# Patient Record
Sex: Female | Born: 1980 | Race: Black or African American | Hispanic: No | Marital: Single | Smoking: Current some day smoker
Health system: Southern US, Community
[De-identification: ages and names within clinical notes are randomized; demographics above are authoritative.]

## PROBLEM LIST (undated history)

## (undated) DIAGNOSIS — B9689 Other specified bacterial agents as the cause of diseases classified elsewhere: Secondary | ICD-10-CM

## (undated) DIAGNOSIS — N760 Acute vaginitis: Secondary | ICD-10-CM

## (undated) HISTORY — PX: OTHER SURGICAL HISTORY: SHX169

---

## 1998-03-30 HISTORY — PX: DILATION AND CURETTAGE OF UTERUS: SHX78

## 1998-10-08 ENCOUNTER — Emergency Department (HOSPITAL_COMMUNITY): Admission: EM | Admit: 1998-10-08 | Discharge: 1998-10-08 | Payer: Self-pay | Admitting: Emergency Medicine

## 1998-11-30 ENCOUNTER — Emergency Department (HOSPITAL_COMMUNITY): Admission: EM | Admit: 1998-11-30 | Discharge: 1998-11-30 | Payer: Self-pay | Admitting: Emergency Medicine

## 1999-02-08 ENCOUNTER — Emergency Department (HOSPITAL_COMMUNITY): Admission: EM | Admit: 1999-02-08 | Discharge: 1999-02-09 | Payer: Self-pay | Admitting: Emergency Medicine

## 1999-03-09 ENCOUNTER — Emergency Department (HOSPITAL_COMMUNITY): Admission: EM | Admit: 1999-03-09 | Discharge: 1999-03-09 | Payer: Self-pay | Admitting: Emergency Medicine

## 1999-03-18 ENCOUNTER — Emergency Department (HOSPITAL_COMMUNITY): Admission: EM | Admit: 1999-03-18 | Discharge: 1999-03-19 | Payer: Self-pay

## 1999-03-18 ENCOUNTER — Encounter: Payer: Self-pay | Admitting: Emergency Medicine

## 1999-11-04 ENCOUNTER — Emergency Department (HOSPITAL_COMMUNITY): Admission: EM | Admit: 1999-11-04 | Discharge: 1999-11-04 | Payer: Self-pay | Admitting: Emergency Medicine

## 1999-12-04 ENCOUNTER — Inpatient Hospital Stay (HOSPITAL_COMMUNITY): Admission: AD | Admit: 1999-12-04 | Discharge: 1999-12-04 | Payer: Self-pay | Admitting: Obstetrics & Gynecology

## 2000-10-08 ENCOUNTER — Emergency Department (HOSPITAL_COMMUNITY): Admission: EM | Admit: 2000-10-08 | Discharge: 2000-10-08 | Payer: Self-pay

## 2000-11-12 ENCOUNTER — Emergency Department (HOSPITAL_COMMUNITY): Admission: EM | Admit: 2000-11-12 | Discharge: 2000-11-12 | Payer: Self-pay | Admitting: Emergency Medicine

## 2000-11-12 ENCOUNTER — Encounter: Payer: Self-pay | Admitting: Emergency Medicine

## 2000-12-18 ENCOUNTER — Emergency Department (HOSPITAL_COMMUNITY): Admission: EM | Admit: 2000-12-18 | Discharge: 2000-12-18 | Payer: Self-pay | Admitting: Emergency Medicine

## 2001-01-15 ENCOUNTER — Inpatient Hospital Stay (HOSPITAL_COMMUNITY): Admission: AD | Admit: 2001-01-15 | Discharge: 2001-01-15 | Payer: Self-pay | Admitting: *Deleted

## 2001-05-10 ENCOUNTER — Inpatient Hospital Stay (HOSPITAL_COMMUNITY): Admission: AD | Admit: 2001-05-10 | Discharge: 2001-05-10 | Payer: Self-pay | Admitting: *Deleted

## 2001-05-24 ENCOUNTER — Emergency Department (HOSPITAL_COMMUNITY): Admission: EM | Admit: 2001-05-24 | Discharge: 2001-05-24 | Payer: Self-pay | Admitting: Emergency Medicine

## 2001-10-17 ENCOUNTER — Inpatient Hospital Stay (HOSPITAL_COMMUNITY): Admission: AD | Admit: 2001-10-17 | Discharge: 2001-10-17 | Payer: Self-pay | Admitting: *Deleted

## 2002-01-11 ENCOUNTER — Emergency Department (HOSPITAL_COMMUNITY): Admission: EM | Admit: 2002-01-11 | Discharge: 2002-01-11 | Payer: Self-pay | Admitting: *Deleted

## 2002-01-11 ENCOUNTER — Encounter: Payer: Self-pay | Admitting: *Deleted

## 2002-05-29 ENCOUNTER — Emergency Department (HOSPITAL_COMMUNITY): Admission: EM | Admit: 2002-05-29 | Discharge: 2002-05-29 | Payer: Self-pay | Admitting: Emergency Medicine

## 2002-07-20 ENCOUNTER — Emergency Department (HOSPITAL_COMMUNITY): Admission: EM | Admit: 2002-07-20 | Discharge: 2002-07-20 | Payer: Self-pay | Admitting: Emergency Medicine

## 2002-07-20 ENCOUNTER — Encounter: Payer: Self-pay | Admitting: Internal Medicine

## 2002-10-31 ENCOUNTER — Emergency Department (HOSPITAL_COMMUNITY): Admission: EM | Admit: 2002-10-31 | Discharge: 2002-10-31 | Payer: Self-pay | Admitting: Emergency Medicine

## 2003-04-02 ENCOUNTER — Emergency Department (HOSPITAL_COMMUNITY): Admission: EM | Admit: 2003-04-02 | Discharge: 2003-04-02 | Payer: Self-pay | Admitting: Emergency Medicine

## 2003-08-08 ENCOUNTER — Emergency Department (HOSPITAL_COMMUNITY): Admission: EM | Admit: 2003-08-08 | Discharge: 2003-08-08 | Payer: Self-pay | Admitting: Emergency Medicine

## 2003-10-09 ENCOUNTER — Inpatient Hospital Stay (HOSPITAL_COMMUNITY): Admission: AD | Admit: 2003-10-09 | Discharge: 2003-10-09 | Payer: Self-pay | Admitting: Gynecology

## 2004-02-19 ENCOUNTER — Emergency Department (HOSPITAL_COMMUNITY): Admission: EM | Admit: 2004-02-19 | Discharge: 2004-02-19 | Payer: Self-pay | Admitting: Emergency Medicine

## 2004-03-13 ENCOUNTER — Emergency Department (HOSPITAL_COMMUNITY): Admission: EM | Admit: 2004-03-13 | Discharge: 2004-03-13 | Payer: Self-pay | Admitting: Emergency Medicine

## 2004-06-30 ENCOUNTER — Emergency Department (HOSPITAL_COMMUNITY): Admission: EM | Admit: 2004-06-30 | Discharge: 2004-06-30 | Payer: Self-pay | Admitting: Emergency Medicine

## 2004-09-15 ENCOUNTER — Emergency Department (HOSPITAL_COMMUNITY): Admission: EM | Admit: 2004-09-15 | Discharge: 2004-09-15 | Payer: Self-pay | Admitting: Emergency Medicine

## 2004-10-09 ENCOUNTER — Emergency Department (HOSPITAL_COMMUNITY): Admission: EM | Admit: 2004-10-09 | Discharge: 2004-10-10 | Payer: Self-pay | Admitting: Emergency Medicine

## 2005-03-31 ENCOUNTER — Emergency Department (HOSPITAL_COMMUNITY): Admission: EM | Admit: 2005-03-31 | Discharge: 2005-04-01 | Payer: Self-pay | Admitting: Emergency Medicine

## 2005-10-08 ENCOUNTER — Other Ambulatory Visit: Admission: RE | Admit: 2005-10-08 | Discharge: 2005-10-08 | Payer: Self-pay | Admitting: Obstetrics and Gynecology

## 2006-01-08 ENCOUNTER — Emergency Department (HOSPITAL_COMMUNITY): Admission: EM | Admit: 2006-01-08 | Discharge: 2006-01-09 | Payer: Self-pay | Admitting: Emergency Medicine

## 2006-04-22 ENCOUNTER — Emergency Department (HOSPITAL_COMMUNITY): Admission: EM | Admit: 2006-04-22 | Discharge: 2006-04-22 | Payer: Self-pay | Admitting: Emergency Medicine

## 2006-09-14 ENCOUNTER — Emergency Department (HOSPITAL_COMMUNITY): Admission: EM | Admit: 2006-09-14 | Discharge: 2006-09-14 | Payer: Self-pay | Admitting: *Deleted

## 2007-01-20 ENCOUNTER — Emergency Department (HOSPITAL_COMMUNITY): Admission: EM | Admit: 2007-01-20 | Discharge: 2007-01-20 | Payer: Self-pay | Admitting: Family Medicine

## 2007-03-25 ENCOUNTER — Emergency Department (HOSPITAL_COMMUNITY): Admission: EM | Admit: 2007-03-25 | Discharge: 2007-03-25 | Payer: Self-pay | Admitting: Emergency Medicine

## 2007-05-23 ENCOUNTER — Inpatient Hospital Stay (HOSPITAL_COMMUNITY): Admission: AD | Admit: 2007-05-23 | Discharge: 2007-05-23 | Payer: Self-pay | Admitting: Family Medicine

## 2007-10-20 ENCOUNTER — Emergency Department (HOSPITAL_COMMUNITY): Admission: EM | Admit: 2007-10-20 | Discharge: 2007-10-20 | Payer: Self-pay | Admitting: Family Medicine

## 2008-06-10 ENCOUNTER — Emergency Department (HOSPITAL_COMMUNITY): Admission: EM | Admit: 2008-06-10 | Discharge: 2008-06-10 | Payer: Self-pay | Admitting: Family Medicine

## 2008-08-14 ENCOUNTER — Encounter (INDEPENDENT_AMBULATORY_CARE_PROVIDER_SITE_OTHER): Payer: Self-pay | Admitting: Obstetrics and Gynecology

## 2008-08-14 ENCOUNTER — Ambulatory Visit (HOSPITAL_COMMUNITY): Admission: RE | Admit: 2008-08-14 | Discharge: 2008-08-14 | Payer: Self-pay | Admitting: Obstetrics and Gynecology

## 2008-12-01 ENCOUNTER — Emergency Department (HOSPITAL_COMMUNITY): Admission: EM | Admit: 2008-12-01 | Discharge: 2008-12-01 | Payer: Self-pay | Admitting: Emergency Medicine

## 2009-04-07 ENCOUNTER — Emergency Department (HOSPITAL_COMMUNITY): Admission: EM | Admit: 2009-04-07 | Discharge: 2009-04-07 | Payer: Self-pay | Admitting: Family Medicine

## 2009-06-30 ENCOUNTER — Emergency Department (HOSPITAL_COMMUNITY): Admission: EM | Admit: 2009-06-30 | Discharge: 2009-06-30 | Payer: Self-pay | Admitting: Family Medicine

## 2009-09-22 ENCOUNTER — Emergency Department (HOSPITAL_COMMUNITY): Admission: EM | Admit: 2009-09-22 | Discharge: 2009-09-22 | Payer: Self-pay | Admitting: Family Medicine

## 2009-10-15 ENCOUNTER — Emergency Department (HOSPITAL_COMMUNITY): Admission: EM | Admit: 2009-10-15 | Discharge: 2009-10-15 | Payer: Self-pay | Admitting: Family Medicine

## 2010-06-14 LAB — POCT URINALYSIS DIP (DEVICE)
Glucose, UA: NEGATIVE mg/dL
Ketones, ur: NEGATIVE mg/dL
Nitrite: NEGATIVE
Protein, ur: 30 mg/dL — AB
Specific Gravity, Urine: >=1.03 (ref 1.005–1.030)
Urobilinogen, UA: 0.2 mg/dL (ref 0.0–1.0)
pH: 6 (ref 5.0–8.0)

## 2010-06-14 LAB — WET PREP, GENITAL
Trich, Wet Prep: NONE SEEN
WBC, Wet Prep HPF POC: NONE SEEN

## 2010-06-14 LAB — POCT PREGNANCY, URINE: Preg Test, Ur: NEGATIVE

## 2010-06-14 LAB — GC/CHLAMYDIA PROBE AMP, GENITAL: Chlamydia, DNA Probe: NEGATIVE

## 2010-06-18 LAB — POCT URINALYSIS DIP (DEVICE)
Ketones, ur: 15 mg/dL — AB
Nitrite: NEGATIVE
Protein, ur: 30 mg/dL — AB
Urobilinogen, UA: 1 mg/dL (ref 0.0–1.0)

## 2010-06-18 LAB — POCT PREGNANCY, URINE: Preg Test, Ur: NEGATIVE

## 2010-06-18 LAB — WET PREP, GENITAL: Yeast Wet Prep HPF POC: NONE SEEN

## 2010-07-04 LAB — POCT RAPID STREP A (OFFICE): Streptococcus, Group A Screen (Direct): NEGATIVE

## 2010-07-08 LAB — CBC
HCT: 31.5 % — ABNORMAL LOW (ref 36.0–46.0)
Hemoglobin: 10.8 g/dL — ABNORMAL LOW (ref 12.0–15.0)
RBC: 3.32 MIL/uL — ABNORMAL LOW (ref 3.87–5.11)
WBC: 7.7 10*3/uL (ref 4.0–10.5)

## 2010-08-12 NOTE — Op Note (Signed)
Erica Norris, Erica Norris               ACCOUNT NO.:  [id number]   MEDICAL RECORD NO.:  [id number]          PATIENT TYPE:  AMB   LOCATION:  SDC                           FACILITY:  [facility]   PHYSICIAN:  Osborn Coho, M.D.   DATE OF BIRTH:  [birth year]   DATE OF PROCEDURE:  08/14/2008  DATE OF DISCHARGE:  08/14/2008                               OPERATIVE REPORT   PREOPERATIVE DIAGNOSES:  1. Menorrhagia.  2. Hematometra.   POSTOPERATIVE DIAGNOSES:  1. Menorrhagia.  2. Hematometra.   PROCEDURE:  1. Hysteroscopy.  2. Dilation and curettage.   ATTENDING DOCTOR:  Osborn Coho, MD   ANESTHESIA:  General.   FINDINGS:  Uterus sounded to 13 cm and dark blood returned after  dilating the cervix.   FLUIDS:  900 mL.   URINE OUTPUT:  Quantity sufficient via straight cath prior to procedure  and after.   ESTIMATED BLOOD LOSS:  Minimal.   SPECIMENS TO PATHOLOGY:  Endometrial curettings.   COMPLICATIONS:  None.   PROCEDURE:  The patient was taken to the operating room after risks,  benefits, and alternatives discussed with the patient.  The patient  verbalized understanding and consent signed and witnessed.  The patient  was placed under general anesthesia and prepped and draped in the normal  sterile fashion.  A bivalve speculum was placed on the patient's vagina  and a paracervical block administered using a total of 10 mL of 1%  lidocaine.  The anterior lip of the cervix was grasped with single-tooth  tenaculum and the cervix was dilated for passage of the hysteroscope.  The small hysteroscope was initially used and there was absolutely no  visualization upon immediate entry.  The hysteroscope was removed and  curettage was performed and endometrium difficult to feel with the  curette.  The cervix was then dilated further and old blood was noted to  come from the cervix.  The curettage was then performed once again and  minimal tissue returned.  Hysteroscopy was performed once again  after  using a slightly larger hysteroscope and still poor visualization was  noted.  However, by slowly taking hysteroscope and looking closely at  the endometrium, there was no obvious intracavitary lesions noted.  All  instruments were removed.  There was good hemostasis at tenaculum site.  Count was correct.  The patient tolerated the procedure well and is  currently awaiting transfer to the recovery room in good condition.       Osborn Coho, M.D.  Electronically Signed     AR/MEDQ  D:  08/14/2008  T:  08/15/2008  Job:  [id number]

## 2010-12-05 ENCOUNTER — Inpatient Hospital Stay (INDEPENDENT_AMBULATORY_CARE_PROVIDER_SITE_OTHER)
Admission: RE | Admit: 2010-12-05 | Discharge: 2010-12-05 | Disposition: A | Discharge status: Home / Self Care | Payer: BC Managed Care – PPO | Source: Ambulatory Visit | Attending: Family Medicine | Admitting: Family Medicine

## 2010-12-05 DIAGNOSIS — N760 Acute vaginitis: Secondary | ICD-10-CM

## 2010-12-05 DIAGNOSIS — M545 Low back pain: Secondary | ICD-10-CM

## 2010-12-05 DIAGNOSIS — A499 Bacterial infection, unspecified: Secondary | ICD-10-CM

## 2010-12-05 LAB — WET PREP, GENITAL: Trich, Wet Prep: NONE SEEN

## 2010-12-05 LAB — POCT URINALYSIS DIP (DEVICE)
Glucose, UA: NEGATIVE mg/dL
Hgb urine dipstick: NEGATIVE
Ketones, ur: NEGATIVE mg/dL
pH: 7.5 (ref 5.0–8.0)

## 2010-12-06 LAB — GC/CHLAMYDIA PROBE AMP, GENITAL
Chlamydia, DNA Probe: NEGATIVE
GC Probe Amp, Genital: NEGATIVE

## 2010-12-19 LAB — URINALYSIS, ROUTINE W REFLEX MICROSCOPIC
Glucose, UA: NEGATIVE
Ketones, ur: NEGATIVE
Nitrite: NEGATIVE
Protein, ur: NEGATIVE
pH: 6.5

## 2010-12-19 LAB — WET PREP, GENITAL
Trich, Wet Prep: NONE SEEN
Yeast Wet Prep HPF POC: NONE SEEN

## 2010-12-19 LAB — CBC
MCHC: 34.3
MCV: 92.7
Platelets: 235
RBC: 3.89

## 2010-12-19 LAB — POCT PREGNANCY, URINE: Preg Test, Ur: NEGATIVE

## 2010-12-26 LAB — POCT PREGNANCY, URINE
Operator id: 247071
Preg Test, Ur: NEGATIVE

## 2010-12-26 LAB — POCT URINALYSIS DIP (DEVICE)
Nitrite: NEGATIVE
Protein, ur: NEGATIVE
Urobilinogen, UA: 1
pH: 6

## 2010-12-26 LAB — WET PREP, GENITAL

## 2011-01-02 LAB — CBC
HCT: 41.5
Hemoglobin: 13.9
MCV: 93.2
Platelets: 216
RBC: 4.45
WBC: 7.2

## 2011-01-02 LAB — I-STAT 8, (EC8 V) (CONVERTED LAB)
Bicarbonate: 21.9
Glucose, Bld: 93
Hemoglobin: 15.3 — ABNORMAL HIGH
Sodium: 137
TCO2: 23
pH, Ven: 7.419 — ABNORMAL HIGH

## 2011-01-02 LAB — URINALYSIS, ROUTINE W REFLEX MICROSCOPIC
Glucose, UA: NEGATIVE
pH: 8

## 2011-01-02 LAB — DIFFERENTIAL
Basophils Absolute: 0.1
Eosinophils Relative: 0
Lymphocytes Relative: 24
Neutrophils Relative %: 65

## 2011-01-02 LAB — POCT I-STAT CREATININE
Creatinine, Ser: 0.9
Operator id: 284141

## 2011-01-07 LAB — GC/CHLAMYDIA PROBE AMP, GENITAL
Chlamydia, DNA Probe: NEGATIVE
GC Probe Amp, Genital: NEGATIVE

## 2011-01-07 LAB — POCT URINALYSIS DIP (DEVICE)
Glucose, UA: NEGATIVE
Nitrite: NEGATIVE
Operator id: 239701
Urobilinogen, UA: 1

## 2011-01-07 LAB — POCT PREGNANCY, URINE: Preg Test, Ur: NEGATIVE

## 2011-01-07 LAB — WET PREP, GENITAL: WBC, Wet Prep HPF POC: NONE SEEN

## 2011-01-14 LAB — URINALYSIS, ROUTINE W REFLEX MICROSCOPIC
Glucose, UA: NEGATIVE
Hgb urine dipstick: NEGATIVE
Specific Gravity, Urine: 1.023
Urobilinogen, UA: 0.2

## 2011-01-14 LAB — GC/CHLAMYDIA PROBE AMP, GENITAL: GC Probe Amp, Genital: NEGATIVE

## 2011-01-14 LAB — WET PREP, GENITAL
Trich, Wet Prep: NONE SEEN
Yeast Wet Prep HPF POC: NONE SEEN

## 2011-02-04 ENCOUNTER — Other Ambulatory Visit: Payer: Self-pay | Admitting: Obstetrics and Gynecology

## 2012-02-15 ENCOUNTER — Encounter (HOSPITAL_COMMUNITY): Payer: Self-pay | Admitting: *Deleted

## 2012-02-15 ENCOUNTER — Emergency Department (INDEPENDENT_AMBULATORY_CARE_PROVIDER_SITE_OTHER)
Admission: EM | Admit: 2012-02-15 | Discharge: 2012-02-15 | Disposition: A | Discharge status: Home / Self Care | Payer: Self-pay | Source: Home / Self Care | Attending: Emergency Medicine | Admitting: Emergency Medicine

## 2012-02-15 DIAGNOSIS — L0292 Furuncle, unspecified: Secondary | ICD-10-CM

## 2012-02-15 DIAGNOSIS — N898 Other specified noninflammatory disorders of vagina: Secondary | ICD-10-CM

## 2012-02-15 DIAGNOSIS — L0293 Carbuncle, unspecified: Secondary | ICD-10-CM

## 2012-02-15 LAB — POCT URINALYSIS DIP (DEVICE)
Bilirubin Urine: NEGATIVE
Hgb urine dipstick: NEGATIVE
Nitrite: NEGATIVE
Specific Gravity, Urine: 1.02 (ref 1.005–1.030)
pH: 7.5 (ref 5.0–8.0)

## 2012-02-15 LAB — WET PREP, GENITAL: Trich, Wet Prep: NONE SEEN

## 2012-02-15 MED ORDER — METRONIDAZOLE 500 MG PO TABS: 500.0000 mg | ORAL_TABLET | Freq: Two times a day (BID) | ORAL | Status: DC

## 2012-02-15 NOTE — ED Provider Notes (Signed)
History     CSN: [id number]  Arrival date & time 02/15/12  4098   First MD Initiated Contact with Patient 02/15/12 0845      Chief Complaint  Patient presents with  . Vaginal Discharge    (Consider location/radiation/quality/duration/timing/severity/associated sxs/prior treatment) Patient is a 31 y.o. female presenting with vaginal discharge.  Vaginal Discharge   31 y.o. female complains of malodorous and thick vaginal discharge for 1 week. Has tried OTC product and states that the vaginal discharge has increase ever since.  Denies abnormal vaginal bleeding, no pruritus, significant pelvic pain or fever. No UTI symptoms. Sexually active, does use condoms, no change in partner. Has a Nuvaring in place.  Last unprotected intercourse 8 days ago.  Denies history of known exposure to STD or symptoms in partner.  No history of STD's. Last Pap Smear was performed in Dec 2012-NIL.  Past history of BV: was treated 6 months ago with metronidazole for BV. Didn't take the last 2 doses; explained that it made her very nauseated.   Reports presence of a small boil in  R inguinal area. Reports to shave and lately changed to [facility] hair removal. Applied heat pad to relieve pain. Noted improvement in size and symptoms.  Pain is 8/10 with exacerbation at ambulation and local palpation. No OTC taken at this present moment.    History reviewed. No pertinent past medical history.  History reviewed. No pertinent past surgical history.  No family history on file.  History  Substance Use Topics  . Smoking status: Never Smoker   . Smokeless tobacco: Not on file  . Alcohol Use: Yes    OB History    Grav Para Term Preterm Abortions TAB SAB Ect Mult Living                  Review of Systems  Constitutional: Negative.   HENT: Negative.   Respiratory: Negative.   Cardiovascular: Negative.   Gastrointestinal: Negative.   Genitourinary: Positive for vaginal discharge. Negative for flank pain,  decreased urine volume, vaginal bleeding, difficulty urinating, vaginal pain, pelvic pain and dyspareunia.    Allergies  Flagyl and Penicillins  Home Medications   Current Outpatient Rx  Name  Route  Sig  Dispense  Refill  . METRONIDAZOLE 500 MG PO TABS   Oral   Take 1 tablet (500 mg total) by mouth 2 (two) times daily.   14 tablet   0     BP 135/72  Pulse 68  Temp 98.3 F (36.8 C) (Oral)  Resp 18  SpO2 100%  Physical Exam  Nursing note and vitals reviewed. Constitutional: She is oriented to person, place, and time. Vital signs are normal. She appears well-developed and well-nourished. She is active and cooperative.  HENT:  Head: Normocephalic and atraumatic.  Eyes: Conjunctivae normal are normal. Pupils are equal, round, and reactive to light. No scleral icterus.  Neck: Trachea normal and normal range of motion. Neck supple.  Cardiovascular: Normal rate, regular rhythm, normal heart sounds and normal pulses.   Pulmonary/Chest: Effort normal and breath sounds normal.  Abdominal: Soft. Normal appearance and bowel sounds are normal. There is no tenderness. Hernia confirmed negative in the right inguinal area and confirmed negative in the left inguinal area.  Genitourinary: Uterus normal. There is no rash, tenderness, lesion or injury on the right labia. There is no rash, tenderness, lesion or injury on the left labia. Uterus is not deviated, not enlarged, not fixed and not tender. Cervix exhibits discharge.  Cervix exhibits no motion tenderness and no friability. Right adnexum displays no mass, no tenderness and no fullness. Left adnexum displays no mass, no tenderness and no fullness. No erythema or bleeding around the vagina. No foreign body around the vagina. No signs of injury around the vagina. Vaginal discharge found.       Tenderness, furuncle on right mons  Lymphadenopathy:       Right: No inguinal adenopathy present.       Left: No inguinal adenopathy present.    Neurological: She is alert and oriented to person, place, and time. She has normal reflexes. No cranial nerve deficit or sensory deficit.  Skin: Skin is warm and dry.  Psychiatric: She has a normal mood and affect. Her speech is normal and behavior is normal. Judgment and thought content normal. Cognition and memory are normal.    ED Course  Procedures (including critical care time)  Labs Reviewed  WET PREP, GENITAL - Abnormal; Notable for the following:    Clue Cells Wet Prep HPF POC FEW (*)     WBC, Wet Prep HPF POC FEW (*)     All other components within normal limits  POCT URINALYSIS DIP (DEVICE)  POCT PREGNANCY, URINE  GC/CHLAMYDIA PROBE AMP   No results found.   1. Vaginal discharge   2. Furuncle       MDM  Avoid drinking alcohol with Flagyl. Take the entire course of antibiotic. Return to the clinic if worsening of any symptoms. It is recommended to not have sexual intercourse for the entire period of treatment.         Johnsie Kindred, NP 02/15/12 1210

## 2012-02-15 NOTE — ED Notes (Signed)
Pt  Reports  Symptoms  Of  Vaginal      Discharge           X   1    Week           As  Well  As   A  Boil  In  Genital  Area     X  3  Weeks            Pt  Also  Reports  Some  Low  abd  Cramping as  Well

## 2012-02-15 NOTE — ED Provider Notes (Signed)
Medical screening examination/treatment/procedure(s) were performed by non-physician practitioner and as supervising physician I was immediately available for consultation/collaboration.  Leslee Home, M.D.   Reuben Likes, MD 02/15/12 [phone]

## 2012-02-16 LAB — GC/CHLAMYDIA PROBE AMP
CT Probe RNA: NEGATIVE
GC Probe RNA: NEGATIVE

## 2013-04-02 ENCOUNTER — Emergency Department (INDEPENDENT_AMBULATORY_CARE_PROVIDER_SITE_OTHER)
Admission: EM | Admit: 2013-04-02 | Discharge: 2013-04-02 | Disposition: A | Discharge status: Home / Self Care | Payer: BC Managed Care – PPO | Source: Home / Self Care | Attending: Family Medicine | Admitting: Family Medicine

## 2013-04-02 ENCOUNTER — Encounter (HOSPITAL_COMMUNITY): Payer: Self-pay | Admitting: Emergency Medicine

## 2013-04-02 ENCOUNTER — Other Ambulatory Visit (HOSPITAL_COMMUNITY)
Admission: RE | Admit: 2013-04-02 | Discharge: 2013-04-02 | Disposition: A | Discharge status: Home / Self Care | Payer: BC Managed Care – PPO | Source: Ambulatory Visit | Attending: Family Medicine | Admitting: Family Medicine

## 2013-04-02 DIAGNOSIS — A084 Viral intestinal infection, unspecified: Secondary | ICD-10-CM

## 2013-04-02 DIAGNOSIS — Z113 Encounter for screening for infections with a predominantly sexual mode of transmission: Secondary | ICD-10-CM | POA: Insufficient documentation

## 2013-04-02 DIAGNOSIS — N760 Acute vaginitis: Secondary | ICD-10-CM

## 2013-04-02 DIAGNOSIS — A088 Other specified intestinal infections: Secondary | ICD-10-CM

## 2013-04-02 HISTORY — DX: Acute vaginitis: N76.0

## 2013-04-02 HISTORY — DX: Other specified bacterial agents as the cause of diseases classified elsewhere: B96.89

## 2013-04-02 LAB — CBC
HEMATOCRIT: 41.1 % (ref 36.0–46.0)
Hemoglobin: 14.1 g/dL (ref 12.0–15.0)
MCH: 31.4 pg (ref 26.0–34.0)
MCHC: 34.3 g/dL (ref 30.0–36.0)
MCV: 91.5 fL (ref 78.0–100.0)
Platelets: 267 10*3/uL (ref 150–400)
RBC: 4.49 MIL/uL (ref 3.87–5.11)
RDW: 12.6 % (ref 11.5–15.5)
WBC: 7.1 10*3/uL (ref 4.0–10.5)

## 2013-04-02 LAB — POCT URINALYSIS DIP (DEVICE)
Bilirubin Urine: NEGATIVE
Glucose, UA: NEGATIVE mg/dL
Hgb urine dipstick: NEGATIVE
Ketones, ur: NEGATIVE mg/dL
LEUKOCYTES UA: NEGATIVE
Nitrite: NEGATIVE
PH: 5.5 (ref 5.0–8.0)
PROTEIN: NEGATIVE mg/dL
Specific Gravity, Urine: >=1.03 (ref 1.005–1.030)
Urobilinogen, UA: 0.2 mg/dL (ref 0.0–1.0)

## 2013-04-02 LAB — COMPREHENSIVE METABOLIC PANEL WITH GFR
ALT: 14 U/L (ref 0–35)
AST: 14 U/L (ref 0–37)
Albumin: 3.5 g/dL (ref 3.5–5.2)
Alkaline Phosphatase: 57 U/L (ref 39–117)
BUN: 10 mg/dL (ref 6–23)
CO2: 25 meq/L (ref 19–32)
Calcium: 9.3 mg/dL (ref 8.4–10.5)
Chloride: 101 meq/L (ref 96–112)
Creatinine, Ser: 0.71 mg/dL (ref 0.50–1.10)
GFR calc Af Amer: >90 mL/min
GFR calc non Af Amer: >90 mL/min
Glucose, Bld: 83 mg/dL (ref 70–99)
Potassium: 3.6 meq/L — ABNORMAL LOW (ref 3.7–5.3)
Sodium: 139 meq/L (ref 137–147)
Total Bilirubin: 0.5 mg/dL (ref 0.3–1.2)
Total Protein: 7.6 g/dL (ref 6.0–8.3)

## 2013-04-02 LAB — POCT I-STAT, CHEM 8
BUN: 9 mg/dL (ref 6–23)
Calcium, Ion: 1.25 mmol/L — ABNORMAL HIGH (ref 1.12–1.23)
Chloride: 103 meq/L (ref 96–112)
Creatinine, Ser: 0.8 mg/dL (ref 0.50–1.10)
Glucose, Bld: 88 mg/dL (ref 70–99)
HCT: 45 % (ref 36.0–46.0)
Hemoglobin: 15.3 g/dL — ABNORMAL HIGH (ref 12.0–15.0)
Potassium: 3.6 meq/L — ABNORMAL LOW (ref 3.7–5.3)
Sodium: 140 meq/L (ref 137–147)
TCO2: 24 mmol/L (ref 0–100)

## 2013-04-02 LAB — POCT PREGNANCY, URINE: Preg Test, Ur: NEGATIVE

## 2013-04-02 MED ORDER — METRONIDAZOLE 0.75 % VA GEL: 1.0000 | Freq: Two times a day (BID) | VAGINAL | Status: DC

## 2013-04-02 NOTE — ED Notes (Signed)
Started with nausea on 12/31; following day started with diarrhea.  Had taken a phenergan at work on 12/31, but relief was short-lived.  Continues with vomiting - able to keep down some fluids, but has intermittent vomiting.  Has had diarrhea x 4-5 each day; took Imodium and has had 3x today.  Denies fevers.  C/O "mild" generalized abd pain and body aches.  Also started with vaginal discharge 12/31 - seems like BV per pt.  Removed Nuva-Ring on Fri.  Has been drinking Gatorade.

## 2013-04-02 NOTE — ED Provider Notes (Signed)
Erica Norris is a 33 y.o. female who presents to Urgent Care today for nausea vomiting and diarrhea present for the last for 5 days. 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 The vomiting has resolved. No bloody diarrhea. She's tried some Imodium and Pepto-Bismol which has helped. She notes that she also has vaginal discharge consistent with prior episodes of bacterial vaginosis. She typically gets BV when she gets sick. She also notes mild abdominal pain. No fevers or chills.   Past Medical History  Diagnosis Date  . BV (bacterial vaginosis)    History  Substance Use Topics  . Smoking status: Never Smoker   . Smokeless tobacco: Not on file  . Alcohol Use: Yes     Comment: occasional   ROS as above Medications reviewed. No current facility-administered medications for this encounter.   Current Outpatient Prescriptions  Medication Sig Dispense Refill  . etonogestrel-ethinyl estradiol (NUVARING) 0.12-0.015 MG/24HR vaginal ring Place 1 each vaginally every 28 (twenty-eight) days. Insert vaginally and leave in place for 3 consecutive weeks, then remove for 1 week.      . metroNIDAZOLE (METROGEL) 0.75 % vaginal gel Place 1 Applicatorful vaginally 2 (two) times daily. 5 days disp qs  70 g  1    Exam:  BP 148/85  Pulse 68  Temp(Src) 98.6 F (37 C) (Oral)  Resp 16  SpO2 100%  LMP 03/02/2013 Gen: Well NAD HEENT: EOMI,  MMM Lungs: Normal work of breathing. CTABL Heart: RRR no MRG Abd: NABS, Soft. Mildly tender palpation right lower quadrant without any rebound. Mild guarding present. Negative psoas sign. Exts: Non edematous BL  LE, warm and well perfused.  GYN: Normal appearing external genitalia. Vaginal canal with white discharge. Normal-appearing cervix. No cervical motion tenderness or adnexal mass or tenderness.  Results for orders placed during the hospital encounter of 04/02/13 (from the past 24 hour(s))  CBC     Status:  None   Collection Time    04/02/13  3:25 PM      Result Value Range   WBC 7.1  4.0 - 10.5 K/uL   RBC 4.49  3.87 - 5.11 MIL/uL   Hemoglobin 14.1  12.0 - 15.0 g/dL   HCT 16.141.1  09.636.0 - 04.546.0 %   MCV 91.5  78.0 - 100.0 fL   MCH 31.4  26.0 - 34.0 pg   MCHC 34.3  30.0 - 36.0 g/dL   RDW 40.912.6  81.111.5 - 91.415.5 %   Platelets 267  150 - 400 K/uL  COMPREHENSIVE METABOLIC PANEL     Status: Abnormal   Collection Time    04/02/13  3:25 PM      Result Value Range   Sodium 139  137 - 147 mEq/L   Potassium 3.6 (*) 3.7 - 5.3 mEq/L   Chloride 101  96 - 112 mEq/L   CO2 25  19 - 32 mEq/L   Glucose, Bld 83  70 - 99 mg/dL   BUN 10  6 - 23 mg/dL   Creatinine, Ser 7.820.71  0.50 - 1.10 mg/dL   Calcium 9.3  8.4 - 95.610.5 mg/dL   Total Protein 7.6  6.0 - 8.3 g/dL   Albumin 3.5  3.5 - 5.2 g/dL   AST 14  0 - 37 U/L   ALT 14  0 - 35 U/L   Alkaline Phosphatase 57  39 - 117 U/L   Total Bilirubin 0.5  0.3 - 1.2  mg/dL   GFR calc non Af Amer >90  >90 mL/min   GFR calc Af Amer >90  >90 mL/min  POCT URINALYSIS DIP (DEVICE)     Status: None   Collection Time    04/02/13  3:47 PM      Result Value Range   Glucose, UA NEGATIVE  NEGATIVE mg/dL   Bilirubin Urine NEGATIVE  NEGATIVE   Ketones, ur NEGATIVE  NEGATIVE mg/dL   Specific Gravity, Urine >=1.030  1.005 - 1.030   Hgb urine dipstick NEGATIVE  NEGATIVE   pH 5.5  5.0 - 8.0   Protein, ur NEGATIVE  NEGATIVE mg/dL   Urobilinogen, UA 0.2  0.0 - 1.0 mg/dL   Nitrite NEGATIVE  NEGATIVE   Leukocytes, UA NEGATIVE  NEGATIVE  POCT PREGNANCY, URINE     Status: None   Collection Time    04/02/13  3:53 PM      Result Value Range   Preg Test, Ur NEGATIVE  NEGATIVE  POCT I-STAT, CHEM 8     Status: Abnormal   Collection Time    04/02/13  3:58 PM      Result Value Range   Sodium 140  137 - 147 mEq/L   Potassium 3.6 (*) 3.7 - 5.3 mEq/L   Chloride 103  96 - 112 mEq/L   BUN 9  6 - 23 mg/dL   Creatinine, Ser 1.32  0.50 - 1.10 mg/dL   Glucose, Bld 88  70 - 99 mg/dL   Calcium, Ion  4.40 (*) 1.12 - 1.23 mmol/L   TCO2 24  0 - 100 mmol/L   Hemoglobin 15.3 (*) 12.0 - 15.0 g/dL   HCT 10.2  72.5 - 36.6 %   No results found.  Assessment and Plan: 33 y.o. female with  1) viral gastroenteritis. Plan for treatment with hydration time and over-the-counter Imodium as needed. Followup with primary care provider. Serious abdominal etiology unlikely given normal labs above. 2) vaginal discharge. Likely BV. Cytology pending. Treat empirically with metronidazole gel vaginal treatment to avoid GI upset with oral metronidazole. Discussed warning signs or symptoms. Please see discharge instructions. Patient expresses understanding.      Rodolph Bong, MD 04/02/13 [phone]

## 2013-04-02 NOTE — Discharge Instructions (Signed)
Thank you for coming in today. Use vaginal MetroGel for 5 days.  Use Imodium as needed Skin hydrated If your belly pain worsens, or you have high fever, bad vomiting, blood in your stool or black tarry stool go to the Emergency Room.

## 2013-05-26 ENCOUNTER — Other Ambulatory Visit: Payer: Self-pay | Admitting: Obstetrics and Gynecology

## 2013-08-06 ENCOUNTER — Emergency Department (INDEPENDENT_AMBULATORY_CARE_PROVIDER_SITE_OTHER): Payer: BC Managed Care – PPO

## 2013-08-06 ENCOUNTER — Encounter (HOSPITAL_COMMUNITY): Payer: Self-pay | Admitting: Emergency Medicine

## 2013-08-06 ENCOUNTER — Emergency Department (INDEPENDENT_AMBULATORY_CARE_PROVIDER_SITE_OTHER)
Admission: EM | Admit: 2013-08-06 | Discharge: 2013-08-06 | Disposition: A | Discharge status: Home / Self Care | Payer: BC Managed Care – PPO | Source: Home / Self Care

## 2013-08-06 DIAGNOSIS — X58XXXA Exposure to other specified factors, initial encounter: Secondary | ICD-10-CM

## 2013-08-06 DIAGNOSIS — S93609A Unspecified sprain of unspecified foot, initial encounter: Secondary | ICD-10-CM

## 2013-08-06 NOTE — ED Provider Notes (Signed)
CSN: [id number]     Arrival date & time 08/06/13  1532 History   None    Chief Complaint  Patient presents with  . Ankle Pain   (Consider location/radiation/quality/duration/timing/severity/associated sxs/prior Treatment) HPI She is here today for left foot and ankle pain. She states this started approximately 1-1/2 weeks ago. She denies any trauma or injury to the area. She denies recent increase in activity. The pain is located on the dorsal aspect of her foot from the second toe extending up to the lateral ankle.  Described as throbbing.  She does report some mild swelling. It is worse with walking. Improve some with rest. She has tried wrapping it and ibuprofen with no improvement. She denies any history of trauma to the ankle or foot.  Past Medical History  Diagnosis Date  . BV (bacterial vaginosis)    Past Surgical History  Procedure Laterality Date  . Dwc    . Dilation and curettage of uterus  2000   Family History  Problem Relation Age of Onset  . Heart attack Father   . Hypertension Sister   . Hypertension Other    History  Substance Use Topics  . Smoking status: Never Smoker   . Smokeless tobacco: Not on file  . Alcohol Use: Yes     Comment: occasional   OB History   Grav Para Term Preterm Abortions TAB SAB Ect Mult Living                 Review of Systems  Constitutional: Negative.   HENT: Negative.   Respiratory: Negative.   Cardiovascular: Negative.   Musculoskeletal: Positive for arthralgias (left foot/ankle pain).    Allergies  Flagyl and Penicillins  Home Medications   Prior to Admission medications   Medication Sig Start Date End Date Taking? Authorizing Provider  etonogestrel-ethinyl estradiol (NUVARING) 0.12-0.015 MG/24HR vaginal ring Place 1 each vaginally every 28 (twenty-eight) days. Insert vaginally and leave in place for 3 consecutive weeks, then remove for 1 week.   Yes Historical Provider, MD  ibuprofen (ADVIL,MOTRIN) 200 MG tablet Take  600 mg by mouth every 6 (six) hours as needed.   Yes Historical Provider, MD  metroNIDAZOLE (METROGEL) 0.75 % vaginal gel Place 1 Applicatorful vaginally 2 (two) times daily. 5 days disp qs 04/02/13   Rodolph BongEvan S Corey, MD   BP 124/94  Pulse 92  Temp(Src) 97.9 F (36.6 C) (Oral)  Resp 16  SpO2 98%  LMP 07/26/2013 Physical Exam  Constitutional: She appears well-developed and well-nourished. No distress.  HENT:  Head: Normocephalic and atraumatic.  Cardiovascular: Normal rate.   Pulmonary/Chest: Effort normal.  Musculoskeletal:       Feet:  Left foot/ankle: no laxity of ankle joint; good strength with some pain on resisted eversion and flexion/extension of 2nd toe; 2+ DP pulse  Skin: She is not diaphoretic.    ED Course  Procedures (including critical care time) Labs Review Labs Reviewed - No data to display  Imaging Review Dg Foot Complete Left  08/06/2013   CLINICAL DATA:  Pain across the top of the left foot. No known injury.  EXAM: LEFT FOOT - COMPLETE 3+ VIEW  COMPARISON:  None.  FINDINGS: Imaged bones, joints and soft tissues appear normal.  IMPRESSION: Negative exam.   Electronically Signed   By: Drusilla Kannerhomas  Dalessio M.D.   On: 08/06/2013 16:46     MDM   1. Sprain of foot    X-ray reviewed and negative for fracture. Suspect strain of her  foot. No evidence for ankle instability. Abdomen conservative management with rest, ice, elevation, NSAIDs. Provided postop shoe to limit mobility of the foot. Follow up with PCP if not improved in 2 weeks    Charm RingsErin J Kajal Scalici, MD 08/06/13 1700

## 2013-08-06 NOTE — Discharge Instructions (Signed)
You sprained something in your foot. Stay off of the foot as much as you can. Continue to ice it at least once a day. Continue with the ibuprofen 3 times a day.  You do not need an ankle brace, but we give you a postop shoe to wear to limit the motion in your foot. This should start to heal in the next one to 2 weeks. It will heal faster the more you can stay off of it.  If it is not improved after 2 weeks please followup with your regular doctor.

## 2013-08-06 NOTE — ED Notes (Signed)
C/o L ankle pain x 1 1/2 weeks.  No known injury.  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  States it is swollen.  Pain is getting worse since Friday. Taking Motrin 600 mg. without relief.

## 2013-08-06 NOTE — ED Provider Notes (Signed)
Medical screening examination/treatment/procedure(s) were performed by a resident physician and as supervising physician I was immediately available for consultation/collaboration.  Leslee Homeavid Shunta Mclaurin, M.D.  Reuben Likesavid C Phila Shoaf, MD 08/06/13 [phone]

## 2013-08-10 ENCOUNTER — Emergency Department (INDEPENDENT_AMBULATORY_CARE_PROVIDER_SITE_OTHER)
Admission: EM | Admit: 2013-08-10 | Discharge: 2013-08-10 | Disposition: A | Discharge status: Home / Self Care | Payer: BC Managed Care – PPO | Source: Home / Self Care | Attending: Family Medicine | Admitting: Family Medicine

## 2013-08-10 ENCOUNTER — Encounter (HOSPITAL_COMMUNITY): Payer: Self-pay | Admitting: Emergency Medicine

## 2013-08-10 DIAGNOSIS — M84376A Stress fracture, unspecified foot, initial encounter for fracture: Secondary | ICD-10-CM

## 2013-08-10 NOTE — ED Provider Notes (Signed)
Erica Norris is a 33 y.o. female who presents to Urgent Care today for left foot pain. Patient was seen 4 days ago for left foot pain. She had a total of about 2 or 3 weeks of pain without injury. She was treated with a postoperative to 4 days ago. This was helping a bit however she were to 16 hour day and has slight slip in the shower the day and her pain is worsened. She is only able to wear the open toe postoperative shoe and one of her 2 jobs. Sure jobs involve prolonged standing. She denies any fevers or chills nausea vomiting or diarrhea. She denies any injury. She notes ibuprofen as well as somewhat helpful for her pain.   Past Medical History  Diagnosis Date  . BV (bacterial vaginosis)    History  Substance Use Topics  . Smoking status: Never Smoker   . Smokeless tobacco: Not on file  . Alcohol Use: Yes     Comment: occasional   ROS as above Medications: No current facility-administered medications for this encounter.   Current Outpatient Prescriptions  Medication Sig Dispense Refill  . etonogestrel-ethinyl estradiol (NUVARING) 0.12-0.015 MG/24HR vaginal ring Place 1 each vaginally every 28 (twenty-eight) days. Insert vaginally and leave in place for 3 consecutive weeks, then remove for 1 week.      Marland Kitchen. ibuprofen (ADVIL,MOTRIN) 200 MG tablet Take 600 mg by mouth every 6 (six) hours as needed.      . metroNIDAZOLE (METROGEL) 0.75 % vaginal gel Place 1 Applicatorful vaginally 2 (two) times daily. 5 days disp qs  70 g  1    Exam:  BP 119/78  Pulse 78  Temp(Src) 98.1 F (36.7 C) (Oral)  Resp 16  SpO2 100%  LMP 07/26/2013 Gen: Well NAD Left foot: Swollen and tender over the mid second metatarsal.  Foot is otherwise normal appearing normal capillary refill pulses and sensation and motion.  Limited musculoskeletal ultrasound left foot:  Significant swelling cortical defect seen at the mid left second metatarsal consistent with stress fracture.  No results found for this or any  previous visit (from the past 24 hour(s)). No results found.  Assessment and Plan: 33 y.o. female with stress fracture of the left second metatarsal. Plan to treat with rigid insoles and vitamin D and calcium. Followup with sports medicine in about 3 weeks for recheck and reevaluation.  Discussed warning signs or symptoms. Please see discharge instructions. Patient expresses understanding.    Rodolph BongEvan S Clearence Vitug, MD 08/10/13 1540

## 2013-08-10 NOTE — ED Notes (Signed)
Reports being seen on Sunday for left foot/ankle injury.  Pt states that she re injured left foot when getting out of the shower she slid causing pain which has been a constant throbbing sensation.  Taking ibuprofen with no relief.

## 2013-08-10 NOTE — Discharge Instructions (Signed)
Thank you for coming in today. Follow up with Dr. Katrinka BlazingSmith. Call now for an appointment in 3 weeks.  Use a rigid insert.  Go to [facility]otech and they will order you one.  You can also order something off the Internet see attached sheet.  Take 2000 mg of calcium and 1000 units of vitamin D daily.  Metatarsal Stress Fracture A stress fracture is a break in a bone of the body that is caused by repeated stress (trauma) that slowly weakens the bone until it eventually breaks. The metatarsal bones are in the middle of the feet, connecting the toes to the ankle. They are vulnerable to stress fractures. Metatarsal stress fractures are the second most common type of stress fracture in athletes. The metatarsal of the pointer toe (second metatarsal) is the most common metatarsal to suffer a stress fracture. SYMPTOMS   Vague, spread out pain or ache. Sometimes, tenderness and swelling in the foot.  Uncommonly, bleeding and bruising in the foot.  Weakness and inability to bear weight on the injured foot.  Paleness and deformity (sometimes). CAUSES  A stress fracture is caused by repeated trauma. This slowly weakens the bone, faster than it can heal itself, until the bone breaks. Stress fractures often follow a sudden change in training schedule. Stress fractures may be related to the loss of menstrual period in women.  RISK INCREASES WITH:   Previous stress fracture.  Sudden changes in training intensity, frequency, or duration Nature conservation officer(military recruits, distance runners).  Bony abnormalities (osteoporosis, tumors).  Metabolism disorders or hormone problems.  Nutrition deficiencies or eating disorders (anorexia or bulimia).  The loss of or irregular menstrual periods in women.  Poor strength and flexibility.  Running on hard surfaces. Poor leg and foot alignment. This includes flat feet.  Poor footwear with poor shock absorbers.  Poor running technique. PREVENTION   Warm up and stretch properly before  activity.  Maintain physical fitness:  Muscle strength.  Endurance and flexibility.  Wear proper and correctly fitted footwear. Replace shoes after 300 to 500 miles of running.  Learn and use proper technique with training and activity.  Increase activity and training gradually.  Treat hormonal disorders. Birth control pills can be helpful for women with menstrual period irregularity.  Correct metabolism and nutrition disorders.  Wear cushioned arch supports for runners with flat feet. PROGNOSIS  With proper treatment, stress fractures usually heal within 6 to 12 weeks. RELATED COMPLICATIONS   Failure to heal (nonunion), especially with stress fractures of the outer foot (upper part of the fifth metatarsal).  Healing in a poor position (malunion).  Recurring stress fracture.  Progression to a complete or displaced fracture.  Risks of surgery: infection, bleeding, injury to nerves (numbness, weakness, paralysis), and need for further surgery.  Repeated stress fracture, not necessarily at the same site. (Occurs in 1 of every 10 patients). TREATMENT Treatment first involves ice and medicine to reduce pain and inflammation. You must rest from any aggravating activity, to avoid making the fracture worse. For severe stress fractures, crutches may be advised, to take weight off the injured foot. Depending on your caregiver's instructions, you may be permitted to perform activities that do not cause pain. Any menstrual, hormonal, or nutritional problems must be addressed and treated. Return to activity must be performed gradually, to avoid reinjuring the foot. Physical therapy may be advised, to help strengthen the foot and regain full function. On rare occasions, surgery is needed. This may be offered if non-surgical treatment is ineffective after  3 to 6 months. MEDICATION   If pain medicine is needed, nonsteroidal anti-inflammatory medicines (NSAIDS) or other minor pain relievers are  often advised.  Do not take pain medicine for 7 days before surgery.  Only take over-the-counter or prescription medicines for pain, discomfort, or fever as directed by your caregiver. SEEK IMMEDIATE MEDICAL CARE IF:  Symptoms get worse or do not improve in 2 weeks, despite treatment. The following occur after immobilization or surgery:  Swelling above or below the fracture site.  Severe, persistent pain.  Blue or gray skin below the fracture site, especially under the toenails. Numbness or loss of feeling below the fracture site.  New, unexplained symptoms develop. (Drugs used in treatment may produce side effects.) Document Released: 03/16/2005 Document Revised: 06/08/2011 Document Reviewed: 06/28/2008 [facility] Patient Information 2014 [place]ExitCare, [place].

## 2013-12-18 ENCOUNTER — Other Ambulatory Visit: Payer: Self-pay | Admitting: Obstetrics and Gynecology

## 2014-03-27 ENCOUNTER — Encounter (HOSPITAL_COMMUNITY): Payer: Self-pay | Admitting: Emergency Medicine

## 2014-03-27 ENCOUNTER — Emergency Department (INDEPENDENT_AMBULATORY_CARE_PROVIDER_SITE_OTHER)
Admission: EM | Admit: 2014-03-27 | Discharge: 2014-03-27 | Disposition: A | Discharge status: Home / Self Care | Payer: BC Managed Care – PPO | Source: Home / Self Care | Attending: Family Medicine | Admitting: Family Medicine

## 2014-03-27 DIAGNOSIS — K529 Noninfective gastroenteritis and colitis, unspecified: Secondary | ICD-10-CM

## 2014-03-27 LAB — POCT URINALYSIS DIP (DEVICE)
Bilirubin Urine: NEGATIVE
Glucose, UA: NEGATIVE mg/dL
Ketones, ur: NEGATIVE mg/dL
Nitrite: NEGATIVE
PROTEIN: NEGATIVE mg/dL
SPECIFIC GRAVITY, URINE: 1.02 (ref 1.005–1.030)
Urobilinogen, UA: 0.2 mg/dL (ref 0.0–1.0)
pH: 6 (ref 5.0–8.0)

## 2014-03-27 LAB — POCT PREGNANCY, URINE: Preg Test, Ur: NEGATIVE

## 2014-03-27 MED ORDER — ONDANSETRON 4 MG PO TBDP
ORAL_TABLET | ORAL | Status: AC
  Filled 2014-03-27: qty 1

## 2014-03-27 MED ORDER — ONDANSETRON HCL 4 MG PO TABS: 4.0000 mg | ORAL_TABLET | Freq: Three times a day (TID) | ORAL | Status: DC | PRN

## 2014-03-27 MED ORDER — ONDANSETRON 4 MG PO TBDP
4.0000 mg | ORAL_TABLET | Freq: Once | ORAL | Status: AC
  Administered 2014-03-27: 4 mg via ORAL

## 2014-03-27 NOTE — ED Provider Notes (Signed)
CSN: [id number]     Arrival date & time 03/27/14  1155 History   First MD Initiated Contact with Patient 03/27/14 1203     Chief Complaint  Patient presents with  . Abdominal Pain  . 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 States diarrhea has improved, still with nausea and occasional vomiting. Denies fever or abdominal pain. States just the muscles of her abdomen are sore from vomiting. Reports herself to be otherwise healthy LNMP: one week ago PCP: Dr. Dareen PianoAnderson   Patient is a 33 y.o. female presenting with vomiting. The history is provided by the patient.  Emesis Severity:  Mild Duration:  4 days Timing:  Intermittent Quality:  Undigested food Able to tolerate:  Liquids Progression:  Improving Recent urination:  Normal Associated symptoms: diarrhea   Associated symptoms: no abdominal pain, no arthralgias, no chills, no cough, no fever, no headaches, no myalgias, no sore throat and no URI   Risk factors: sick contacts   Risk factors: no suspect food intake and no travel to endemic areas     Past Medical History  Diagnosis Date  . BV (bacterial vaginosis)    Past Surgical History  Procedure Laterality Date  . Dwc    . Dilation and curettage of uterus  2000   Family History  Problem Relation Age of Onset  . Heart attack Father   . Hypertension Sister   . Hypertension Other    History  Substance Use Topics  . Smoking status: Never Smoker   . Smokeless tobacco: Not on file  . Alcohol Use: Yes     Comment: occasional   OB History    No data available     Review of Systems  Constitutional: Negative for chills.  HENT: Negative for sore throat.   Gastrointestinal: Positive for vomiting and diarrhea. Negative for abdominal pain.  Musculoskeletal: Negative for myalgias and arthralgias.  Neurological: Negative  for headaches.  All other systems reviewed and are negative.   Allergies  Flagyl and Penicillins  Home Medications   Prior to Admission medications   Medication Sig Start Date End Date Taking? Authorizing Provider  Pseudoeph-Doxylamine-DM-APAP (NYQUIL PO) Take by mouth.   Yes Historical Provider, MD  etonogestrel-ethinyl estradiol (NUVARING) 0.12-0.015 MG/24HR vaginal ring Place 1 each vaginally every 28 (twenty-eight) days. Insert vaginally and leave in place for 3 consecutive weeks, then remove for 1 week.    Historical Provider, MD  ibuprofen (ADVIL,MOTRIN) 200 MG tablet Take 600 mg by mouth every 6 (six) hours as needed.    Historical Provider, MD  metroNIDAZOLE (METROGEL) 0.75 % vaginal gel Place 1 Applicatorful vaginally 2 (two) times daily. 5 days disp qs 04/02/13   Rodolph BongEvan S Corey, MD  ondansetron (ZOFRAN) 4 MG tablet Take 1 tablet (4 mg total) by mouth every 8 (eight) hours as needed for nausea or vomiting. 03/27/14   Mathis FareJennifer Lee H Honesty Menta, PA   BP 125/85 mmHg  Pulse 71  Temp(Src) 98 F (36.7 C) (Oral)  Resp 20  SpO2 99%  LMP 03/13/2014 Physical Exam  Constitutional: She is oriented to person, place, and time. She appears well-developed and well-nourished. No distress.  HENT:  Head: Normocephalic and atraumatic.  Eyes: Conjunctivae are normal. No scleral icterus.  Cardiovascular: Normal rate, regular rhythm and normal heart sounds.   Pulmonary/Chest: Effort normal and breath sounds normal.  Abdominal: Soft. Normal appearance and bowel sounds are normal. She  exhibits no distension. There is no tenderness. There is no CVA tenderness.  Musculoskeletal: Normal range of motion.  Neurological: She is alert and oriented to person, place, and time.  Skin: Skin is warm and dry.  Psychiatric: She has a normal mood and affect. Her behavior is normal.  Nursing note and vitals reviewed.   ED Course  Procedures (including critical care time) Labs Review Labs Reviewed  POCT  URINALYSIS DIP (DEVICE) - Abnormal; Notable for the following:    Hgb urine dipstick TRACE (*)    Leukocytes, UA TRACE (*)    All other components within normal limits  POCT PREGNANCY, URINE    Imaging Review No results found.   MDM   1. Gastroenteritis     Continue clear liquid diet at home until vomiting resolves. Zofran as directed. UA unremarkable UPT negative   Ria ClockJennifer Lee H Shanell Aden, GeorgiaPA 03/27/14 1239

## 2014-03-27 NOTE — Discharge Instructions (Signed)
Food Choices to Help Relieve Diarrhea °When you have diarrhea, the foods you eat and your eating habits are very important. Choosing the right foods and drinks can help relieve diarrhea. Also, because diarrhea can last up to 7 days, you need to replace lost fluids and electrolytes (such as sodium, potassium, and chloride) in order to help prevent dehydration.  °WHAT GENERAL GUIDELINES DO I NEED TO FOLLOW? °· Slowly drink 1 cup (8 oz) of fluid for each episode of diarrhea. If you are getting enough fluid, your urine will be clear or pale yellow. °· Eat starchy foods. Some good choices include white rice, white toast, pasta, low-fiber cereal, baked potatoes (without the skin), saltine crackers, and bagels. °· Avoid large servings of any cooked vegetables. °· Limit fruit to two servings per day. A serving is ½ cup or 1 small piece. °· Choose foods with less than 2 g of fiber per serving. °· Limit fats to less than 8 tsp (38 g) per day. °· Avoid fried foods. °· Eat foods that have probiotics in them. Probiotics can be found in certain dairy products. °· Avoid foods and beverages that may increase the speed at which food moves through the stomach and intestines (gastrointestinal tract). Things to avoid include: °¨ High-fiber foods, such as dried fruit, raw fruits and vegetables, nuts, seeds, and whole grain foods. °¨ Spicy foods and high-fat foods. °¨ Foods and beverages sweetened with high-fructose corn syrup, honey, or sugar alcohols such as xylitol, sorbitol, and mannitol. °WHAT FOODS ARE RECOMMENDED? °Grains °White rice. White, French, or pita breads (fresh or toasted), including plain rolls, buns, or bagels. White pasta. Saltine, soda, or graham crackers. Pretzels. Low-fiber cereal. Cooked cereals made with water (such as cornmeal, farina, or cream cereals). Plain muffins. Matzo. Melba toast. Zwieback.  °Vegetables °Potatoes (without the skin). Strained tomato and vegetable juices. Most well-cooked and canned  vegetables without seeds. Tender lettuce. °Fruits °Cooked or canned applesauce, apricots, cherries, fruit cocktail, grapefruit, peaches, pears, or plums. Fresh bananas, apples without skin, cherries, grapes, cantaloupe, grapefruit, peaches, oranges, or plums.  °Meat and Other Protein Products °Baked or boiled chicken. Eggs. Tofu. Fish. Seafood. Smooth peanut butter. Ground or well-cooked tender beef, ham, veal, lamb, pork, or poultry.  °Dairy °Plain yogurt, kefir, and unsweetened liquid yogurt. Lactose-free milk, buttermilk, or soy milk. Plain hard cheese. °Beverages °Sport drinks. Clear broths. Diluted fruit juices (except prune). Regular, caffeine-free sodas such as ginger ale. Water. Decaffeinated teas. Oral rehydration solutions. Sugar-free beverages not sweetened with sugar alcohols. °Other °Bouillon, broth, or soups made from recommended foods.  °The items listed above may not be a complete list of recommended foods or beverages. Contact your dietitian for more options. °WHAT FOODS ARE NOT RECOMMENDED? °Grains °Whole grain, whole wheat, bran, or rye breads, rolls, pastas, crackers, and cereals. Wild or brown rice. Cereals that contain more than 2 g of fiber per serving. Corn tortillas or taco shells. Cooked or dry oatmeal. Granola. Popcorn. °Vegetables °Raw vegetables. Cabbage, broccoli, Brussels sprouts, artichokes, baked beans, beet greens, corn, kale, legumes, peas, sweet potatoes, and yams. Potato skins. Cooked spinach and cabbage. °Fruits °Dried fruit, including raisins and dates. Raw fruits. Stewed or dried prunes. Fresh apples with skin, apricots, mangoes, pears, raspberries, and strawberries.  °Meat and Other Protein Products °Chunky peanut butter. Nuts and seeds. Beans and lentils. Bacon.  °Dairy °High-fat cheeses. Milk, chocolate milk, and beverages made with milk, such as milk shakes. Cream. Ice cream. °Sweets and Desserts °Sweet rolls, doughnuts, and sweet breads. Pancakes   and waffles. °Fats and  Oils °Butter. Cream sauces. Margarine. Salad oils. Plain salad dressings. Olives. Avocados.  °Beverages °Caffeinated beverages (such as coffee, tea, soda, or energy drinks). Alcoholic beverages. Fruit juices with pulp. Prune juice. Soft drinks sweetened with high-fructose corn syrup or sugar alcohols. °Other °Coconut. Hot sauce. Chili powder. Mayonnaise. Gravy. Cream-based or milk-based soups.  °The items listed above may not be a complete list of foods and beverages to avoid. Contact your dietitian for more information. °WHAT SHOULD I DO IF I BECOME DEHYDRATED? °Diarrhea can sometimes lead to dehydration. Signs of dehydration include dark urine and dry mouth and skin. If you think you are dehydrated, you should rehydrate with an oral rehydration solution. These solutions can be purchased at pharmacies, retail stores, or online.  °Drink ½-1 cup (120-240 mL) of oral rehydration solution each time you have an episode of diarrhea. If drinking this amount makes your diarrhea worse, try drinking smaller amounts more often. For example, drink 1-3 tsp (5-15 mL) every 5-10 minutes.  °A general rule for staying hydrated is to drink 1½-2 L of fluid per day. Talk to your health care provider about the specific amount you should be drinking each day. Drink enough fluids to keep your urine clear or pale yellow. °Document Released: 06/06/2003 Document Revised: 03/21/2013 Document Reviewed: 02/06/2013 °ExitCare® Patient Information ©2015 [employer]. This information is not intended to replace advice given to you by your health care provider. Make sure you discuss any questions you have with your health care provider. °Viral Gastroenteritis °Viral gastroenteritis is also known as stomach flu. This condition affects the stomach and intestinal tract. It can cause sudden diarrhea and vomiting. The illness typically lasts 3 to 8 days. Most people develop an immune response that eventually gets rid of the virus. While this natural  response develops, the virus can make you quite ill. °CAUSES  °Many different viruses can cause gastroenteritis, such as rotavirus or noroviruses. You can catch one of these viruses by consuming contaminated food or water. You may also catch a virus by sharing utensils or other personal items with an infected person or by touching a contaminated surface. °SYMPTOMS  °The most common symptoms are diarrhea and vomiting. These problems can cause a severe loss of body fluids (dehydration) and a body salt (electrolyte) imbalance. Other symptoms may include: °· Fever. °· Headache. °· Fatigue. °· Abdominal pain. °DIAGNOSIS  °Your caregiver can usually diagnose viral gastroenteritis based on your symptoms and a physical exam. A stool sample may also be taken to test for the presence of viruses or other infections. °TREATMENT  °This illness typically goes away on its own. Treatments are aimed at rehydration. The most serious cases of viral gastroenteritis involve vomiting so severely that you are not able to keep fluids down. In these cases, fluids must be given through an intravenous line (IV). °HOME CARE INSTRUCTIONS  °· Drink enough fluids to keep your urine clear or pale yellow. Drink small amounts of fluids frequently and increase the amounts as tolerated. °· Ask your caregiver for specific rehydration instructions. °· Avoid: °¨ Foods high in sugar. °¨ Alcohol. °¨ Carbonated drinks. °¨ Tobacco. °¨ Juice. °¨ Caffeine drinks. °¨ Extremely hot or cold fluids. °¨ Fatty, greasy foods. °¨ Too much intake of anything at one time. °¨ Dairy products until 24 to 48 hours after diarrhea stops. °· You may consume probiotics. Probiotics are active cultures of beneficial bacteria. They may lessen the amount and number of diarrheal stools in adults. Probiotics   can be found in yogurt with active cultures and in supplements. °· Wash your hands well to avoid spreading the virus. °· Only take over-the-counter or prescription medicines for  pain, discomfort, or fever as directed by your caregiver. Do not give aspirin to children. Antidiarrheal medicines are not recommended. °· Ask your caregiver if you should continue to take your regular prescribed and over-the-counter medicines. °· Keep all follow-up appointments as directed by your caregiver. °SEEK IMMEDIATE MEDICAL CARE IF:  °· You are unable to keep fluids down. °· You do not urinate at least once every 6 to 8 hours. °· You develop shortness of breath. °· You notice blood in your stool or vomit. This may look like coffee grounds. °· You have abdominal pain that increases or is concentrated in one small area (localized). °· You have persistent vomiting or diarrhea. °· You have a fever. °· The patient is a child younger than 3 months, and he or she has a fever. °· The patient is a child older than 3 months, and he or she has a fever and persistent symptoms. °· The patient is a child older than 3 months, and he or she has a fever and symptoms suddenly get worse. °· The patient is a baby, and he or she has no tears when crying. °MAKE SURE YOU:  °· Understand these instructions. °· Will watch your condition. °· Will get help right away if you are not doing well or get worse. °Document Released: 03/16/2005 Document Revised: 06/08/2011 Document Reviewed: 12/31/2010 °ExitCare® Patient Information ©2015 [employer]. This information is not intended to replace advice given to you by your health care provider. Make sure you discuss any questions you have with your health care provider. ° °

## 2014-03-27 NOTE — ED Notes (Signed)
Reports noticing she was not feeling well last week. Saturday she started vomiting. Episodes of hot and cold. Reports she is able to hold down pedialyte, no fever, no diarrhea.

## 2014-06-24 ENCOUNTER — Emergency Department (HOSPITAL_COMMUNITY)
Admission: EM | Admit: 2014-06-24 | Discharge: 2014-06-24 | Disposition: A | Discharge status: Home / Self Care | Payer: BLUE CROSS/BLUE SHIELD | Attending: Emergency Medicine | Admitting: Emergency Medicine

## 2014-06-24 ENCOUNTER — Emergency Department (HOSPITAL_COMMUNITY): Payer: BLUE CROSS/BLUE SHIELD

## 2014-06-24 ENCOUNTER — Encounter (HOSPITAL_COMMUNITY): Payer: Self-pay | Admitting: Emergency Medicine

## 2014-06-24 DIAGNOSIS — R079 Chest pain, unspecified: Secondary | ICD-10-CM | POA: Insufficient documentation

## 2014-06-24 DIAGNOSIS — Z720 Tobacco use: Secondary | ICD-10-CM | POA: Insufficient documentation

## 2014-06-24 DIAGNOSIS — Z79899 Other long term (current) drug therapy: Secondary | ICD-10-CM | POA: Diagnosis not present

## 2014-06-24 DIAGNOSIS — Z880 Allergy status to penicillin: Secondary | ICD-10-CM | POA: Insufficient documentation

## 2014-06-24 LAB — BASIC METABOLIC PANEL
Anion gap: 10 (ref 5–15)
BUN: <5 mg/dL — ABNORMAL LOW (ref 6–23)
CALCIUM: 9.2 mg/dL (ref 8.4–10.5)
CHLORIDE: 106 mmol/L (ref 96–112)
CO2: 22 mmol/L (ref 19–32)
Creatinine, Ser: 0.76 mg/dL (ref 0.50–1.10)
GLUCOSE: 93 mg/dL (ref 70–99)
Potassium: 3.9 mmol/L (ref 3.5–5.1)
Sodium: 138 mmol/L (ref 135–145)

## 2014-06-24 LAB — CBC
HCT: 40.1 % (ref 36.0–46.0)
Hemoglobin: 13.1 g/dL (ref 12.0–15.0)
MCH: 30 pg (ref 26.0–34.0)
MCHC: 32.7 g/dL (ref 30.0–36.0)
MCV: 92 fL (ref 78.0–100.0)
Platelets: 252 10*3/uL (ref 150–400)
RBC: 4.36 MIL/uL (ref 3.87–5.11)
RDW: 13 % (ref 11.5–15.5)
WBC: 8.8 10*3/uL (ref 4.0–10.5)

## 2014-06-24 LAB — I-STAT TROPONIN, ED
Troponin i, poc: 0 ng/mL (ref 0.00–0.08)
Troponin i, poc: 0 ng/mL (ref 0.00–0.08)

## 2014-06-24 MED ORDER — MORPHINE SULFATE 4 MG/ML IJ SOLN: 4.0000 mg | Freq: Once | INTRAMUSCULAR | Status: DC

## 2014-06-24 MED ORDER — IBUPROFEN 100 MG/5ML PO SUSP
ORAL | Status: AC
  Filled 2014-06-24: qty 40

## 2014-06-24 MED ORDER — IBUPROFEN 100 MG/5ML PO SUSP
800.0000 mg | Freq: Once | ORAL | Status: AC
  Administered 2014-06-24: 800 mg via ORAL
  Filled 2014-06-24: qty 40

## 2014-06-24 MED ORDER — IBUPROFEN 800 MG PO TABS
800.0000 mg | ORAL_TABLET | Freq: Once | ORAL | Status: DC
  Filled 2014-06-24: qty 1

## 2014-06-24 NOTE — Discharge Instructions (Signed)
If you were given medicines take as directed.  If you are on coumadin or contraceptives realize their levels and effectiveness is altered by many different medicines.  If you have any reaction (rash, tongues swelling, other) to the medicines stop taking and see a physician.   Please follow up as directed and return to the ER or see a physician for new or worsening symptoms.  Thank you. Filed Vitals:   06/24/14 1140  BP: 133/79  Pulse: 81  Temp: 98.8 F (37.1 C)  TempSrc: Oral  Resp: 18  Height: 5\' 6"  (1.676 m)  Weight: 172 lb (78.019 kg)  SpO2: 99%

## 2014-06-24 NOTE — ED Provider Notes (Signed)
CSN: [id number]     Arrival date & time 06/24/14  1128 History   First MD Initiated Contact with Patient 06/24/14 1130     Chief Complaint  Patient presents with  . Chest Pain     (Consider location/radiation/quality/duration/timing/severity/associated sxs/prior Treatment) HPI Comments: 34 year old female with no significant medical history presents with intermittent chest pain lasting 2-3 minutes for the past few weeks. It was more prolonged today at work.. No exertional symptoms or syncopal. Worse at times with position and movement. Patient has family history of cardiac and current smoker however no other risk factors. No classic pulm embolus and risk factors, patient has NuvaRing. Patient denies blood clot history, active cancer, recent major trauma or surgery, unilateral leg swelling/ pain, recent long travel, hemoptysis or oral contraceptives.   Patient is a 34 y.o. female presenting with chest pain. The history is provided by the patient.  Chest Pain Associated symptoms: no abdominal pain, no back pain, no fever, no headache, no shortness of breath and not vomiting     Past Medical History  Diagnosis Date  . BV (bacterial vaginosis)    Past Surgical History  Procedure Laterality Date  . Dwc    . Dilation and curettage of uterus  2000   Family History  Problem Relation Age of Onset  . Heart attack Father   . Hypertension Sister   . Hypertension Other    History  Substance Use Topics  . Smoking status: Current Some Day Smoker  . Smokeless tobacco: Not on file  . Alcohol Use: Yes     Comment: occasional   OB History    No data available     Review of Systems  Constitutional: Negative for fever and chills.  HENT: Negative for congestion.   Eyes: Negative for visual disturbance.  Respiratory: Negative for shortness of breath.   Cardiovascular: Positive for chest pain. Negative for leg swelling.  Gastrointestinal: Negative for vomiting and abdominal pain.   Genitourinary: Negative for dysuria and flank pain.  Musculoskeletal: Negative for back pain, neck pain and neck stiffness.  Skin: Negative for rash.  Neurological: Negative for light-headedness and headaches.      Allergies  Flagyl and Penicillins  Home Medications   Prior to Admission medications   Medication Sig Start Date End Date Taking? Authorizing Provider  etonogestrel-ethinyl estradiol (NUVARING) 0.12-0.015 MG/24HR vaginal ring Place 1 each vaginally every 28 (twenty-eight) days. Insert vaginally and leave in place for 3 consecutive weeks, then remove for 1 week.    Historical Provider, MD  metroNIDAZOLE (METROGEL) 0.75 % vaginal gel Place 1 Applicatorful vaginally 2 (two) times daily. 5 days disp qs 04/02/13   Rodolph Bong, MD  ondansetron (ZOFRAN) 4 MG tablet Take 1 tablet (4 mg total) by mouth every 8 (eight) hours as needed for nausea or vomiting. 03/27/14   Mathis Fare Presson, PA   BP 117/70 mmHg  Pulse 79  Temp(Src) 98.8 F (37.1 C) (Oral)  Resp 16  Ht  (1.676 m)  Wt 172 lb (78.019 kg)  BMI 27.77 kg/m2  SpO2 99%  LMP 06/10/2014 Physical Exam  Constitutional: She is oriented to person, place, and time. She appears well-developed and well-nourished.  HENT:  Head: Normocephalic and atraumatic.  Eyes: Conjunctivae are normal. Right eye exhibits no discharge. Left eye exhibits no discharge.  Neck: Normal range of motion. Neck supple. No tracheal deviation present.  Cardiovascular: Normal rate, regular rhythm and intact distal pulses.   Pulmonary/Chest: Effort normal and breath  sounds normal.  Abdominal: Soft. She exhibits no distension. There is no tenderness. There is no guarding.  Musculoskeletal: She exhibits no edema.  Neurological: She is alert and oriented to person, place, and time.  Skin: Skin is warm. No rash noted.  Psychiatric: She has a normal mood and affect.  Nursing note and vitals reviewed.   ED Course  Procedures (including critical  care time) Labs Review Labs Reviewed  BASIC METABOLIC PANEL - Abnormal; Notable for the following:    BUN <5 (*)    All other components within normal limits  CBC  I-STAT TROPOININ, ED  Rosezena SensorI-STAT TROPOININ, ED    Imaging Review Dg Chest 2 View  06/24/2014   CLINICAL DATA:  Left side chest pain for weeks  EXAM: CHEST  2 VIEW  COMPARISON:  None.  FINDINGS: Cardiomediastinal silhouette is unremarkable. No acute infiltrate or pleural effusion. No pulmonary edema. Bony thorax is unremarkable.  IMPRESSION: No active cardiopulmonary disease.   Electronically Signed   By: Natasha MeadLiviu  Pop M.D.   On: 06/24/2014 12:19     EKG Interpretation   Date/Time:  Sunday June 24 2014 11:35:30 EDT Ventricular Rate:  89 PR Interval:  157 QRS Duration: 74 QT Interval:  354 QTC Calculation: 431 R Axis:   78 Text Interpretation:  Sinus rhythm Probable left atrial enlargement  Confirmed by Marleen Moret  MD, Suzie Vandam ([id number]) on 06/24/2014 12:07:27 PM      MDM   Final diagnoses:  Chest pain, unspecified chest pain type   Patient presents with intermittent brief atypical chest pain. Patient has 2 risk factors for cardiac, low-risk overall. Patient chest pain-free, discussed close follow-up outpatient with a primary care doctor for possible stress test if this continues. Plan for delta troponin the ER. Patient PE RC negative no further workup for blood clots indicated at this time.  Delta troponin neg, outpt fup.   Results and differential diagnosis were discussed with the patient/parent/guardian. Close follow up outpatient was discussed, comfortable with the plan.   Medications  morphine 4 MG/ML injection 4 mg (not administered)    Filed Vitals:   06/24/14 1315 06/24/14 1345 06/24/14 1420 06/24/14 1525  BP: 119/84 115/79 110/71 117/70  Pulse: 75 68 80 79  Temp:      TempSrc:      Resp: 26 21 18 16   Height:      Weight:      SpO2: 98% 99% 100% 99%    Final diagnoses:  Chest pain, unspecified chest pain  type       Blane OharaJoshua Kynadee Dam, MD 06/24/14 1530

## 2014-06-24 NOTE — ED Notes (Signed)
C/o intermitent CP for several weeks, worse today while working, no other complaints, no recent illness, A/O X4, NAD

## 2014-07-20 ENCOUNTER — Encounter (HOSPITAL_COMMUNITY): Payer: Self-pay | Admitting: Emergency Medicine

## 2014-07-20 ENCOUNTER — Emergency Department (HOSPITAL_COMMUNITY)
Admission: EM | Admit: 2014-07-20 | Discharge: 2014-07-20 | Disposition: A | Discharge status: Home / Self Care | Payer: BLUE CROSS/BLUE SHIELD | Attending: Emergency Medicine | Admitting: Emergency Medicine

## 2014-07-20 DIAGNOSIS — N760 Acute vaginitis: Secondary | ICD-10-CM | POA: Insufficient documentation

## 2014-07-20 DIAGNOSIS — M5431 Sciatica, right side: Secondary | ICD-10-CM | POA: Diagnosis not present

## 2014-07-20 DIAGNOSIS — Z880 Allergy status to penicillin: Secondary | ICD-10-CM | POA: Insufficient documentation

## 2014-07-20 DIAGNOSIS — Z79899 Other long term (current) drug therapy: Secondary | ICD-10-CM | POA: Diagnosis not present

## 2014-07-20 DIAGNOSIS — M549 Dorsalgia, unspecified: Secondary | ICD-10-CM | POA: Diagnosis present

## 2014-07-20 DIAGNOSIS — Z720 Tobacco use: Secondary | ICD-10-CM | POA: Insufficient documentation

## 2014-07-20 MED ORDER — PREDNISONE 20 MG PO TABS: 40.0000 mg | ORAL_TABLET | Freq: Every day | ORAL | Status: DC

## 2014-07-20 MED ORDER — TRAMADOL HCL 50 MG PO TABS: 50.0000 mg | ORAL_TABLET | Freq: Four times a day (QID) | ORAL | Status: DC | PRN

## 2014-07-20 MED ORDER — CYCLOBENZAPRINE HCL 10 MG PO TABS: 10.0000 mg | ORAL_TABLET | Freq: Two times a day (BID) | ORAL | Status: DC | PRN

## 2014-07-20 NOTE — ED Provider Notes (Signed)
CSN: [id number]     Arrival date & time 07/20/14  0750 History   First [id number] Initiated Contact with Patient 07/20/14 [phone]     Chief Complaint  Patient presents with  . Back Pain     (Consider location/radiation/quality/duration/timing/severity/associated sxs/prior Treatment) HPI Erica Norris is a 34 y.o. female no medical problems presents to emergency department complaining of back pain. Patient states her back pain started this morning when she woke up. 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 She denies any prior back issues. She states pain is in the lower back, radiates into the right thigh. Denies any numbness or weakness in her foot. Denies any fever. No trouble with bladder or bowel control. No medications prior to their arrival.  Past Medical History  Diagnosis Date  . BV (bacterial vaginosis)    Past Surgical History  Procedure Laterality Date  . Dwc    . Dilation and curettage of uterus  2000   Family History  Problem Relation Age of Onset  . Heart attack Father   . Hypertension Sister   . Hypertension Other    History  Substance Use Topics  . Smoking status: Current Some Day Smoker  . Smokeless tobacco: Not on file  . Alcohol Use: Yes     Comment: occasional   OB History    No data available     Review of Systems  Constitutional: Negative for fever and chills.  Respiratory: Negative for cough, chest tightness and shortness of breath.   Cardiovascular: Negative for chest pain, palpitations and leg swelling.  Gastrointestinal: Negative for nausea, vomiting, abdominal pain and diarrhea.  Musculoskeletal: Negative for myalgias, arthralgias, neck pain and neck stiffness.  Skin: Negative for rash.  Neurological: Negative for dizziness, weakness and headaches.  All other systems reviewed and are negative.     Allergies  Flagyl and Penicillins  Home Medications   Prior to Admission medications    Medication Sig Start Date End Date Taking? Authorizing Provider  etonogestrel-ethinyl estradiol (NUVARING) 0.12-0.015 MG/24HR vaginal ring Place 1 each vaginally every 28 (twenty-eight) days. Pt replaces every 10th of month. Insert vaginally and leave in place for 3 consecutive weeks, then remove for 1 week.    Historical Provider, MD  metroNIDAZOLE (METROGEL) 0.75 % vaginal gel Place 1 Applicatorful vaginally 2 (two) times daily. 5 days disp qs 04/02/13   Rodolph BongEvan S Corey, MD  ondansetron (ZOFRAN) 4 MG tablet Take 1 tablet (4 mg total) by mouth every 8 (eight) hours as needed for nausea or vomiting. 03/27/14   Mathis FareJennifer Lee H Presson, PA   BP 127/69 mmHg  Pulse 86  Temp(Src) 97.9 F (36.6 C) (Oral)  Resp 17  Ht 5\' 6"  (1.676 m)  Wt 172 lb (78.019 kg)  BMI 27.77 kg/m2  SpO2 100%  LMP 06/10/2014 Physical Exam  Constitutional: She is oriented to person, place, and time. She appears well-developed and well-nourished. No distress.  HENT:  Head: Normocephalic.  Eyes: Conjunctivae are normal.  Neck: Neck supple.  Cardiovascular: Normal rate, regular rhythm and normal heart sounds.   Pulmonary/Chest: Effort normal and breath sounds normal. No respiratory distress. She has no wheezes. She has no rales.  Abdominal: Soft. Bowel sounds are normal. She exhibits no distension. There is no tenderness. There is no rebound.  Musculoskeletal: She exhibits no edema.  Midline lumbar spine tenderness. Tenderness extends into right si joint and right buttock. Pain with right straight leg raise.  Neurological: She is alert and oriented to person, place, and time. No cranial nerve deficit.  5/5 and equal upper and lower extremity strength bilaterally. Equal grip strength bilaterally. Normal finger to nose and heel to shin. No pronator drift. Patellar reflexes 2+   Skin: Skin is warm and dry.  Psychiatric: She has a normal mood and affect. Her behavior is normal.  Nursing note and vitals reviewed.   ED Course   Procedures (including critical care time) Labs Review Labs Reviewed - No data to display  Imaging Review No results found.   EKG Interpretation None      MDM   Final diagnoses:  Sciatica, right    Pt with lower back pain onset this morning. No injuries. Neurovascularly intact. No red flags to suggest cauda equina. No fever. No hx of the same. Exam consistent with lumbosacral radiculopathy. Home with steroids, flexeril, ultram. Follow up with pcp.   Filed Vitals:   07/20/14 0755  BP: 127/69  Pulse: 86  Temp: 97.9 F (36.6 C)  TempSrc: Oral  Resp: 17  Height:  (1.676 m)  Weight: 172 lb (78.019 kg)  SpO2: 100%       Jaynie Crumble, PA-C 07/20/14 1631  Jerelyn Scott, MD 07/20/14 [phone]

## 2014-07-20 NOTE — ED Notes (Signed)
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   Patient complains of low back pain that radiates to R leg.   Patient denies injury.   Patient states does not have a history of back pain.

## 2014-07-20 NOTE — Discharge Instructions (Signed)
Take tylenol or motrin for pain. Tramadol for severe pain. Prednisone for inflammation until all gone. Flexeril for spasms. Follow up with primary care doctor for recheck.   Sciatica Sciatica is pain, weakness, numbness, or tingling along the path of the sciatic nerve. The nerve starts in the lower back and runs down the back of each leg. The nerve controls the muscles in the lower leg and in the back of the knee, while also providing sensation to the back of the thigh, lower leg, and the sole of your foot. Sciatica is a symptom of another medical condition. For instance, nerve damage or certain conditions, such as a herniated disk or bone spur on the spine, pinch or put pressure on the sciatic nerve. This causes the pain, weakness, or other sensations normally associated with sciatica. Generally, sciatica only affects one side of the body. CAUSES   Herniated or slipped disc.  Degenerative disk disease.  A pain disorder involving the narrow muscle in the buttocks (piriformis syndrome).  Pelvic injury or fracture.  Pregnancy.  Tumor (rare). SYMPTOMS  Symptoms can vary from mild to very severe. The symptoms usually travel from the low back to the buttocks and down the back of the leg. Symptoms can include:  Mild tingling or dull aches in the lower back, leg, or hip.  Numbness in the back of the calf or sole of the foot.  Burning sensations in the lower back, leg, or hip.  Sharp pains in the lower back, leg, or hip.  Leg weakness.  Severe back pain inhibiting movement. These symptoms may get worse with coughing, sneezing, laughing, or prolonged sitting or standing. Also, being overweight may worsen symptoms. DIAGNOSIS  Your caregiver will perform a physical exam to look for common symptoms of sciatica. He or she may ask you to do certain movements or activities that would trigger sciatic nerve pain. Other tests may be performed to find the cause of the sciatica. These may  include:  Blood tests.  X-rays.  Imaging tests, such as an MRI or CT scan. TREATMENT  Treatment is directed at the cause of the sciatic pain. Sometimes, treatment is not necessary and the pain and discomfort goes away on its own. If treatment is needed, your caregiver may suggest:  Over-the-counter medicines to relieve pain.  Prescription medicines, such as anti-inflammatory medicine, muscle relaxants, or narcotics.  Applying heat or ice to the painful area.  Steroid injections to lessen pain, irritation, and inflammation around the nerve.  Reducing activity during periods of pain.  Exercising and stretching to strengthen your abdomen and improve flexibility of your spine. Your caregiver may suggest losing weight if the extra weight makes the back pain worse.  Physical therapy.  Surgery to eliminate what is pressing or pinching the nerve, such as a bone spur or part of a herniated disk. HOME CARE INSTRUCTIONS   Only take over-the-counter or prescription medicines for pain or discomfort as directed by your caregiver.  Apply ice to the affected area for 20 minutes, 3-4 times a day for the first 48-72 hours. Then try heat in the same way.  Exercise, stretch, or perform your usual activities if these do not aggravate your pain.  Attend physical therapy sessions as directed by your caregiver.  Keep all follow-up appointments as directed by your caregiver.  Do not wear high heels or shoes that do not provide proper support.  Check your mattress to see if it is too soft. A firm mattress may lessen your pain  and discomfort. SEEK IMMEDIATE MEDICAL CARE IF:   You lose control of your bowel or bladder (incontinence).  You have increasing weakness in the lower back, pelvis, buttocks, or legs.  You have redness or swelling of your back.  You have a burning sensation when you urinate.  You have pain that gets worse when you lie down or awakens you at night.  Your pain is worse  than you have experienced in the past.  Your pain is lasting longer than 4 weeks.  You are suddenly losing weight without reason. MAKE SURE YOU:  Understand these instructions.  Will watch your condition.  Will get help right away if you are not doing well or get worse. Document Released: 03/10/2001 Document Revised: 09/15/2011 Document Reviewed: 07/26/2011 [facility] Patient Information 2015 [employer] [place], [place]. This information is not intended to replace advice given to you by your health care provider. Make sure you discuss any questions you have with your health care provider.

## 2014-11-14 ENCOUNTER — Emergency Department (INDEPENDENT_AMBULATORY_CARE_PROVIDER_SITE_OTHER)
Admission: EM | Admit: 2014-11-14 | Discharge: 2014-11-14 | Disposition: A | Discharge status: Home / Self Care | Payer: BLUE CROSS/BLUE SHIELD | Source: Home / Self Care | Attending: Emergency Medicine | Admitting: Emergency Medicine

## 2014-11-14 ENCOUNTER — Encounter (HOSPITAL_COMMUNITY): Payer: Self-pay | Admitting: Emergency Medicine

## 2014-11-14 ENCOUNTER — Other Ambulatory Visit (HOSPITAL_COMMUNITY)
Admission: RE | Admit: 2014-11-14 | Discharge: 2014-11-14 | Disposition: A | Discharge status: Home / Self Care | Payer: BLUE CROSS/BLUE SHIELD | Source: Ambulatory Visit | Attending: Emergency Medicine | Admitting: Emergency Medicine

## 2014-11-14 DIAGNOSIS — B373 Candidiasis of vulva and vagina: Secondary | ICD-10-CM

## 2014-11-14 DIAGNOSIS — Z113 Encounter for screening for infections with a predominantly sexual mode of transmission: Secondary | ICD-10-CM | POA: Diagnosis present

## 2014-11-14 DIAGNOSIS — B3731 Acute candidiasis of vulva and vagina: Secondary | ICD-10-CM

## 2014-11-14 DIAGNOSIS — N760 Acute vaginitis: Secondary | ICD-10-CM | POA: Insufficient documentation

## 2014-11-14 LAB — POCT URINALYSIS DIP (DEVICE)
Bilirubin Urine: NEGATIVE
Glucose, UA: NEGATIVE mg/dL
HGB URINE DIPSTICK: NEGATIVE
KETONES UR: NEGATIVE mg/dL
Nitrite: NEGATIVE
PROTEIN: NEGATIVE mg/dL
SPECIFIC GRAVITY, URINE: 1.025 (ref 1.005–1.030)
Urobilinogen, UA: 0.2 mg/dL (ref 0.0–1.0)
pH: 6 (ref 5.0–8.0)

## 2014-11-14 LAB — POCT PREGNANCY, URINE: Preg Test, Ur: NEGATIVE

## 2014-11-14 MED ORDER — MELOXICAM 15 MG PO TABS: 15.0000 mg | ORAL_TABLET | Freq: Every day | ORAL | Status: DC | PRN

## 2014-11-14 MED ORDER — FLUCONAZOLE 150 MG PO TABS: 150.0000 mg | ORAL_TABLET | ORAL | Status: DC

## 2014-11-14 NOTE — ED Notes (Signed)
Pt states she has been having vaginal discharge and itching for two weeks.  About a week ago she developed right lower back pain and lower abdominal pain.  Pt says she has not been sexually active in 16 months, but recently had an encounter, but used protection.  She has tried OTC Monistat.

## 2014-11-14 NOTE — Discharge Instructions (Signed)
You have a yeast infection. I think this is causing your pain as well. Take Diflucan once a week for 4 weeks. Use the meloxicam daily as needed for pain. We will call you if any of your testing comes back positive. Follow-up as needed.

## 2014-11-14 NOTE — ED Provider Notes (Signed)
CSN: [id number]     Arrival date & time 11/14/14  1540 History   First MD Initiated Contact with Patient 11/14/14 1556     Chief Complaint  Patient presents with  . Back Pain  . Abdominal Pain  . Vaginal Discharge  . Vaginal Itching   (Consider location/radiation/quality/duration/timing/severity/associated sxs/prior Treatment) HPI She is a 34 year old woman here for evaluation of vaginal discharge. She states this started about 2 weeks ago after her period.  She reports a creamy white discharge. She also reports vaginal and vulvar itching. No odor. About a week ago, she developed an ache in her lower back and pelvic area. No fevers or chills. No nausea or vomiting. She did have protected sex one week ago. This was with a known partner.  She has tried Monistat without improvement.  Past Medical History  Diagnosis Date  . BV (bacterial vaginosis)    Past Surgical History  Procedure Laterality Date  . Dwc    . Dilation and curettage of uterus  2000   Family History  Problem Relation Age of Onset  . Heart attack Father   . Hypertension Sister   . Hypertension Other    Social History  Substance Use Topics  . Smoking status: Current Some Day Smoker  . Smokeless tobacco: None  . Alcohol Use: Yes     Comment: occasional   OB History    No data available     Review of Systems As in history of present illness Allergies  Flagyl and Penicillins  Home Medications   Prior to Admission medications   Medication Sig Start Date End Date Taking? Authorizing Provider  etonogestrel-ethinyl estradiol (NUVARING) 0.12-0.015 MG/24HR vaginal ring Place 1 each vaginally every 28 (twenty-eight) days. Pt replaces every 10th of month. Insert vaginally and leave in place for 3 consecutive weeks, then remove for 1 week.    Historical Provider, MD  fluconazole (DIFLUCAN) 150 MG tablet Take 1 tablet (150 mg total) by mouth once a week. 11/14/14   Charm Rings, MD  meloxicam (MOBIC) 15 MG tablet Take  1 tablet (15 mg total) by mouth daily as needed for pain. 11/14/14   Charm Rings, MD   BP 115/79 mmHg  Pulse 89  Temp(Src) 99.1 F (37.3 C) (Oral)  Resp 14  SpO2 98%  LMP 10/24/2014 (Approximate) Physical Exam  Constitutional: She is oriented to person, place, and time. She appears well-developed and well-nourished. No distress.  Cardiovascular: Normal rate.   Pulmonary/Chest: Effort normal.  Genitourinary: Uterus is not enlarged and not tender. Cervix exhibits no motion tenderness and no discharge. Right adnexum displays no mass and no tenderness. Left adnexum displays no mass and no tenderness. No bleeding in the vagina. No foreign body around the vagina. Vaginal discharge (chunky white discharge) found.  She has some white discharge along the inner labia  Musculoskeletal:  Back: No erythema or edema. Pain is located just above the gluteal cleft. No point tenderness.  Neurological: She is alert and oriented to person, place, and time.    ED Course  Procedures (including critical care time) Labs Review Labs Reviewed  POCT URINALYSIS DIP (DEVICE) - Abnormal; Notable for the following:    Leukocytes, UA LARGE (*)    All other components within normal limits  URINE CULTURE  POCT PREGNANCY, URINE  CERVICOVAGINAL ANCILLARY ONLY    Imaging Review No results found.   MDM   1. Yeast vaginitis    I suspect all of her symptoms are secondary to  a yeast infection. Treat with Diflucan weekly for 4 weeks. Meloxicam as needed for pain. Follow-up as needed.    Charm Rings, MD 11/14/14 [phone]

## 2014-11-15 LAB — CERVICOVAGINAL ANCILLARY ONLY
Chlamydia: NEGATIVE
Neisseria Gonorrhea: NEGATIVE

## 2014-11-15 NOTE — ED Notes (Addendum)
Final report of STD testing negative for GC, chlamydia, gardnerella. Still waiting for UA culture report

## 2014-11-16 LAB — URINE CULTURE

## 2014-11-16 LAB — CERVICOVAGINAL ANCILLARY ONLY: WET PREP (BD AFFIRM): POSITIVE — AB

## 2014-11-16 NOTE — ED Notes (Signed)
Final report shows multiple species 

## 2014-12-31 ENCOUNTER — Emergency Department (INDEPENDENT_AMBULATORY_CARE_PROVIDER_SITE_OTHER)
Admission: EM | Admit: 2014-12-31 | Discharge: 2014-12-31 | Disposition: A | Discharge status: Home / Self Care | Payer: BLUE CROSS/BLUE SHIELD | Source: Home / Self Care | Attending: Family Medicine | Admitting: Family Medicine

## 2014-12-31 ENCOUNTER — Encounter (HOSPITAL_COMMUNITY): Payer: Self-pay | Admitting: *Deleted

## 2014-12-31 DIAGNOSIS — J069 Acute upper respiratory infection, unspecified: Secondary | ICD-10-CM

## 2014-12-31 LAB — POCT RAPID STREP A: STREPTOCOCCUS, GROUP A SCREEN (DIRECT): NEGATIVE

## 2014-12-31 MED ORDER — IPRATROPIUM BROMIDE 0.06 % NA SOLN: 2.0000 | Freq: Four times a day (QID) | NASAL | Status: DC

## 2014-12-31 MED ORDER — CETIRIZINE HCL 10 MG PO TABS: 10.0000 mg | ORAL_TABLET | Freq: Every day | ORAL | Status: DC

## 2014-12-31 NOTE — ED Provider Notes (Signed)
CSN: [id number]     Arrival date & time 12/31/14  1301 History   First MD Initiated Contact with Patient 12/31/14 1312     Chief Complaint  Patient presents with  . Sore Throat   (Consider location/radiation/quality/duration/timing/severity/associated sxs/prior Treatment) Patient is a 34 y.o. female presenting with pharyngitis. The history is provided by the patient.  Sore Throat This is a new problem. The current episode started yesterday. The problem has been gradually worsening. Pertinent negatives include no chest pain and no abdominal pain. Associated symptoms comments: Many clients at work sick with similar sx.. The symptoms are aggravated by swallowing.    Past Medical History  Diagnosis Date  . BV (bacterial vaginosis)    Past Surgical History  Procedure Laterality Date  . Dwc    . Dilation and curettage of uterus  2000   Family History  Problem Relation Age of Onset  . Heart attack Father   . Hypertension Sister   . Hypertension Other    Social History  Substance Use Topics  . Smoking status: Current Some Day Smoker  . Smokeless tobacco: None  . Alcohol Use: Yes     Comment: occasional   OB History    No data available     Review of Systems  Constitutional: Negative.   HENT: Positive for congestion, postnasal drip, rhinorrhea and sore throat.   Respiratory: Negative.   Cardiovascular: Negative.  Negative for chest pain.  Gastrointestinal: Negative for abdominal pain.  All other systems reviewed and are negative.   Allergies  Flagyl and Penicillins  Home Medications   Prior to Admission medications   Medication Sig Start Date End Date Taking? Authorizing Provider  cetirizine (ZYRTEC) 10 MG tablet Take 1 tablet (10 mg total) by mouth daily. One tab daily for allergies 12/31/14   Linna Hoff, MD  etonogestrel-ethinyl estradiol (NUVARING) 0.12-0.015 MG/24HR vaginal ring Place 1 each vaginally every 28 (twenty-eight) days. Pt replaces every 10th of month.  Insert vaginally and leave in place for 3 consecutive weeks, then remove for 1 week.    Historical Provider, MD  fluconazole (DIFLUCAN) 150 MG tablet Take 1 tablet (150 mg total) by mouth once a week. 11/14/14   Charm Rings, MD  ipratropium (ATROVENT) 0.06 % nasal spray Place 2 sprays into both nostrils 4 (four) times daily. 12/31/14   Linna Hoff, MD  meloxicam (MOBIC) 15 MG tablet Take 1 tablet (15 mg total) by mouth daily as needed for pain. 11/14/14   Charm Rings, MD   Meds Ordered and Administered this Visit  Medications - No data to display  BP 115/73 mmHg  Pulse 87  Temp(Src) 98.8 F (37.1 C) (Oral)  Resp 16  SpO2 100%  LMP 12/13/2014 No data found.   Physical Exam  Constitutional: She is oriented to person, place, and time. She appears well-developed and well-nourished. No distress.  HENT:  Head: Normocephalic.  Right Ear: External ear normal.  Left Ear: External ear normal.  Mouth/Throat: Oropharynx is clear and moist.  Eyes: Pupils are equal, round, and reactive to light.  Neck: Normal range of motion. Neck supple.  Cardiovascular: Normal heart sounds.   Pulmonary/Chest: Effort normal and breath sounds normal.  Lymphadenopathy:    She has no cervical adenopathy.  Neurological: She is alert and oriented to person, place, and time.  Skin: Skin is warm and dry.  Nursing note and vitals reviewed.   ED Course  Procedures (including critical care time)  Labs Review Labs  Reviewed  CULTURE, GROUP A STREP  POCT RAPID STREP A   Strep neg.  Imaging Review No results found.   Visual Acuity Review  Right Eye Distance:   Left Eye Distance:   Bilateral Distance:    Right Eye Near:   Left Eye Near:    Bilateral Near:         MDM   1. Acute URI    rx atrovent and zyrtec.    Linna Hoff, MD 01/02/15 [phone]

## 2014-12-31 NOTE — ED Notes (Signed)
Pt  Reports   Symptoms   Of    sorethroat     X  1  Day         Pt     Reports      Symptoms  Not  releived  By  otc  Throat  Spray

## 2015-01-02 LAB — CULTURE, GROUP A STREP

## 2015-01-07 NOTE — ED Notes (Signed)
Final report of strep testing available for review. positive for group B strep , not treated. Discussed w Dr Dow Adolph. Who authorized Cefdinir 300 mg, PO, BID , quant #20. Pharmacy of choice listed as [facility], [address]. Called and left detailed message for patient on her phone. Called [facility], and spoke directly w pharmacist , Alllen

## 2016-06-12 ENCOUNTER — Encounter (HOSPITAL_COMMUNITY): Payer: Self-pay | Admitting: Emergency Medicine

## 2016-06-12 ENCOUNTER — Ambulatory Visit (HOSPITAL_COMMUNITY)
Admission: EM | Admit: 2016-06-12 | Discharge: 2016-06-12 | Disposition: A | Discharge status: Home / Self Care | Payer: Self-pay | Attending: Emergency Medicine | Admitting: Emergency Medicine

## 2016-06-12 DIAGNOSIS — B9689 Other specified bacterial agents as the cause of diseases classified elsewhere: Secondary | ICD-10-CM

## 2016-06-12 DIAGNOSIS — R109 Unspecified abdominal pain: Secondary | ICD-10-CM

## 2016-06-12 DIAGNOSIS — N760 Acute vaginitis: Secondary | ICD-10-CM

## 2016-06-12 DIAGNOSIS — N898 Other specified noninflammatory disorders of vagina: Secondary | ICD-10-CM

## 2016-06-12 DIAGNOSIS — Z3202 Encounter for pregnancy test, result negative: Secondary | ICD-10-CM

## 2016-06-12 DIAGNOSIS — A599 Trichomoniasis, unspecified: Secondary | ICD-10-CM | POA: Insufficient documentation

## 2016-06-12 LAB — POCT PREGNANCY, URINE: Preg Test, Ur: NEGATIVE

## 2016-06-12 LAB — POCT URINALYSIS DIP (DEVICE)
Bilirubin Urine: NEGATIVE
Glucose, UA: NEGATIVE mg/dL
HGB URINE DIPSTICK: NEGATIVE
Ketones, ur: NEGATIVE mg/dL
Leukocytes, UA: NEGATIVE
NITRITE: NEGATIVE
PH: 6.5 (ref 5.0–8.0)
PROTEIN: NEGATIVE mg/dL
Specific Gravity, Urine: 1.025 (ref 1.005–1.030)
UROBILINOGEN UA: 0.2 mg/dL (ref 0.0–1.0)

## 2016-06-12 MED ORDER — METRONIDAZOLE 500 MG PO TABS: 500.0000 mg | ORAL_TABLET | Freq: Two times a day (BID) | ORAL | 0 refills | Status: AC

## 2016-06-12 MED ORDER — FLUCONAZOLE 150 MG PO TABS: 150.0000 mg | ORAL_TABLET | Freq: Once | ORAL | 1 refills | Status: AC

## 2016-06-12 NOTE — ED Provider Notes (Signed)
HPI  SUBJECTIVE:  Erica Norris is a 36 y.o. female who presents with 2 weeks of fishy vaginal odor, vaginal discharge and intermittent low midline crampy seconds long nonradiating pelvic pain.  No dysuria, urgency, frequency, cloudy or oderous urine, hematuria,  genital blisters, vaginal itching. No aggravating factors. Tried OTC yeast cream with some improvement in her symptoms.  No fevers, N/V, abd pain, back pain. No recent abx use. Pt sexually active with new female partner of 5 months who is  asxatic. Does use condoms consistently. STD's not a concern today. Similar sx before when had BV. She also has a past medical history of yeast infections. No history of gonorrhea chlamydia, Trichomonas, syphilis, herpes, HIV. No h/o PID, ectopic pregnancy. No h/o DM, hypertension. LMP: 2/22. Denies possibility of being pregnant. PMD: None.    Past Medical History:  Diagnosis Date  . BV (bacterial vaginosis)     Past Surgical History:  Procedure Laterality Date  . DILATION AND CURETTAGE OF UTERUS  2000  . [facility]      Family History  Problem Relation Age of Onset  . Heart attack Father   . Hypertension Other   . Hypertension Sister     Social History  Substance Use Topics  . Smoking status: Current Some Day Smoker  . Smokeless tobacco: Never Used  . Alcohol use Yes     Comment: occasional    No current facility-administered medications for this encounter.   Current Outpatient Prescriptions:  .  fluconazole (DIFLUCAN) 150 MG tablet, Take 1 tablet (150 mg total) by mouth once. 1 tab po x 1. May repeat in 72 hours if no improvement, Disp: 2 tablet, Rfl: 1 .  metroNIDAZOLE (FLAGYL) 500 MG tablet, Take 1 tablet (500 mg total) by mouth 2 (two) times daily., Disp: 14 tablet, Rfl: 0  Allergies  Allergen Reactions  . Flagyl [Metronidazole]     GI upset  . Penicillins Hives     ROS  As noted in HPI.   Physical Exam  BP 120/81 (BP Location: Left Arm)   Pulse 85   Temp 98.8 F (37.1  C) (Oral)   Resp 20   LMP 05/21/2016   SpO2 99%   Constitutional: Well developed, well nourished, no acute distress Eyes:  EOMI, conjunctiva normal bilaterally HENT: Normocephalic, atraumatic,mucus membranes moist Respiratory: Normal inspiratory effort Cardiovascular: Normal rate GI: nondistended soft, nontender. No suprapubic tenderness  GU: External genitalia normal. + white vaginal d/c.  Normal vaginal mucosa.  Normal os. thick oderous white vaginal discharge. Uterus smooth,  NT. No CMT. No adnexal tenderness. No adnexal masses.  Chaperone present during exam skin: No rash, skin intact Musculoskeletal: no deformities Neurologic: Alert & oriented x 3, no focal neuro deficits Psychiatric: Speech and behavior appropriate   ED Course   Medications - No data to display  Orders Placed This Encounter  Procedures  . HIV antibody    Standing Status:   Standing    Number of Occurrences:   1  . RPR    Standing Status:   Standing    Number of Occurrences:   1  . POCT urinalysis dip (device)    Standing Status:   Standing    Number of Occurrences:   1  . Pregnancy, urine POC    Standing Status:   Standing    Number of Occurrences:   1    Results for orders placed or performed during the hospital encounter of 06/12/16 (from the past 24 hour(s))  POCT  urinalysis dip (device)     Status: None   Collection Time: 06/12/16  6:22 PM  Result Value Ref Range   Glucose, UA NEGATIVE NEGATIVE mg/dL   Bilirubin Urine NEGATIVE NEGATIVE   Ketones, ur NEGATIVE NEGATIVE mg/dL   Specific Gravity, Urine 1.025 1.005 - 1.030   Hgb urine dipstick NEGATIVE NEGATIVE   pH 6.5 5.0 - 8.0   Protein, ur NEGATIVE NEGATIVE mg/dL   Urobilinogen, UA 0.2 0.0 - 1.0 mg/dL   Nitrite NEGATIVE NEGATIVE   Leukocytes, UA NEGATIVE NEGATIVE  Pregnancy, urine POC     Status: None   Collection Time: 06/12/16  6:27 PM  Result Value Ref Range   Preg Test, Ur NEGATIVE NEGATIVE   No results found.  ED Clinical  Impression  BV (bacterial vaginosis)   ED Assessment/Plan  UA negative for UTI. She is not pregnant.  Patient states she is nauseous with Flagyl. It is not anaphylaxis. Offered to give her clindamycin by mouth or Flagyl or clindamycin vaginal cream, but she declined clindamycin due to concerns for C. difficile. She declined the vaginal gels due to possible cost.  H&P most c/w yeast infection vs BV. Sent off GC/chlamydia, wet prep, HIV, RPR. Will not treat empirically now.  Will send home with flagyl and diflucan for yeast infection. Advised pt to refrain from sexual contact until she knows lab results, symptoms resolve, and partner(s) are treated if necessary. Pt provided working phone number. Follow-up with PMD of choice as needed. Providing primary care referral list. Discussed labs, MDM, plan and followup with patient. Pt agrees with plan.   Meds ordered this encounter  Medications  . metroNIDAZOLE (FLAGYL) 500 MG tablet    Sig: Take 1 tablet (500 mg total) by mouth 2 (two) times daily.    Dispense:  14 tablet    Refill:  0  . fluconazole (DIFLUCAN) 150 MG tablet    Sig: Take 1 tablet (150 mg total) by mouth once. 1 tab po x 1. May repeat in 72 hours if no improvement    Dispense:  2 tablet    Refill:  1    *This clinic note was created using Scientist, clinical (histocompatibility and immunogenetics). Therefore, there may be occasional mistakes despite careful proofreading.  ?    Domenick Gong, MD 06/12/16 [phone]

## 2016-06-12 NOTE — ED Triage Notes (Signed)
Here for abd pain onset 2 weeks associated w/white, milky vag d/c   Denies fevers, urinary sx  Sexually active in a monogamous relationship... Does use condoms  Has tried OTC yeast meds w/no relief.   A&O x4... NAD

## 2016-06-12 NOTE — Discharge Instructions (Signed)
Take the medication as written. Give us a working phone number so that we can contact you if needed. Refrain from sexual contact until you know your results and your partner(s) are treated. Return to the ER if you get worse, have a fever >100.4, or for any concerns.  ° °Go to [email or web address] to look up your medications. This will give you a list of where you can find your prescriptions at the most affordable prices.   °

## 2016-06-13 LAB — RPR: RPR: NONREACTIVE

## 2016-06-13 LAB — HIV ANTIBODY (ROUTINE TESTING W REFLEX): HIV Screen 4th Generation wRfx: NONREACTIVE

## 2016-06-15 LAB — CERVICOVAGINAL ANCILLARY ONLY
CHLAMYDIA, DNA PROBE: NEGATIVE
Neisseria Gonorrhea: NEGATIVE
WET PREP (BD AFFIRM): POSITIVE — AB

## 2016-10-23 IMAGING — DX DG CHEST 2V
2 series · 2 of 2 positions shown · non-contrast
Comparison: None.

CLINICAL DATA: Left side chest pain for weeks

EXAM:
CHEST  2 VIEW

[chest pa]
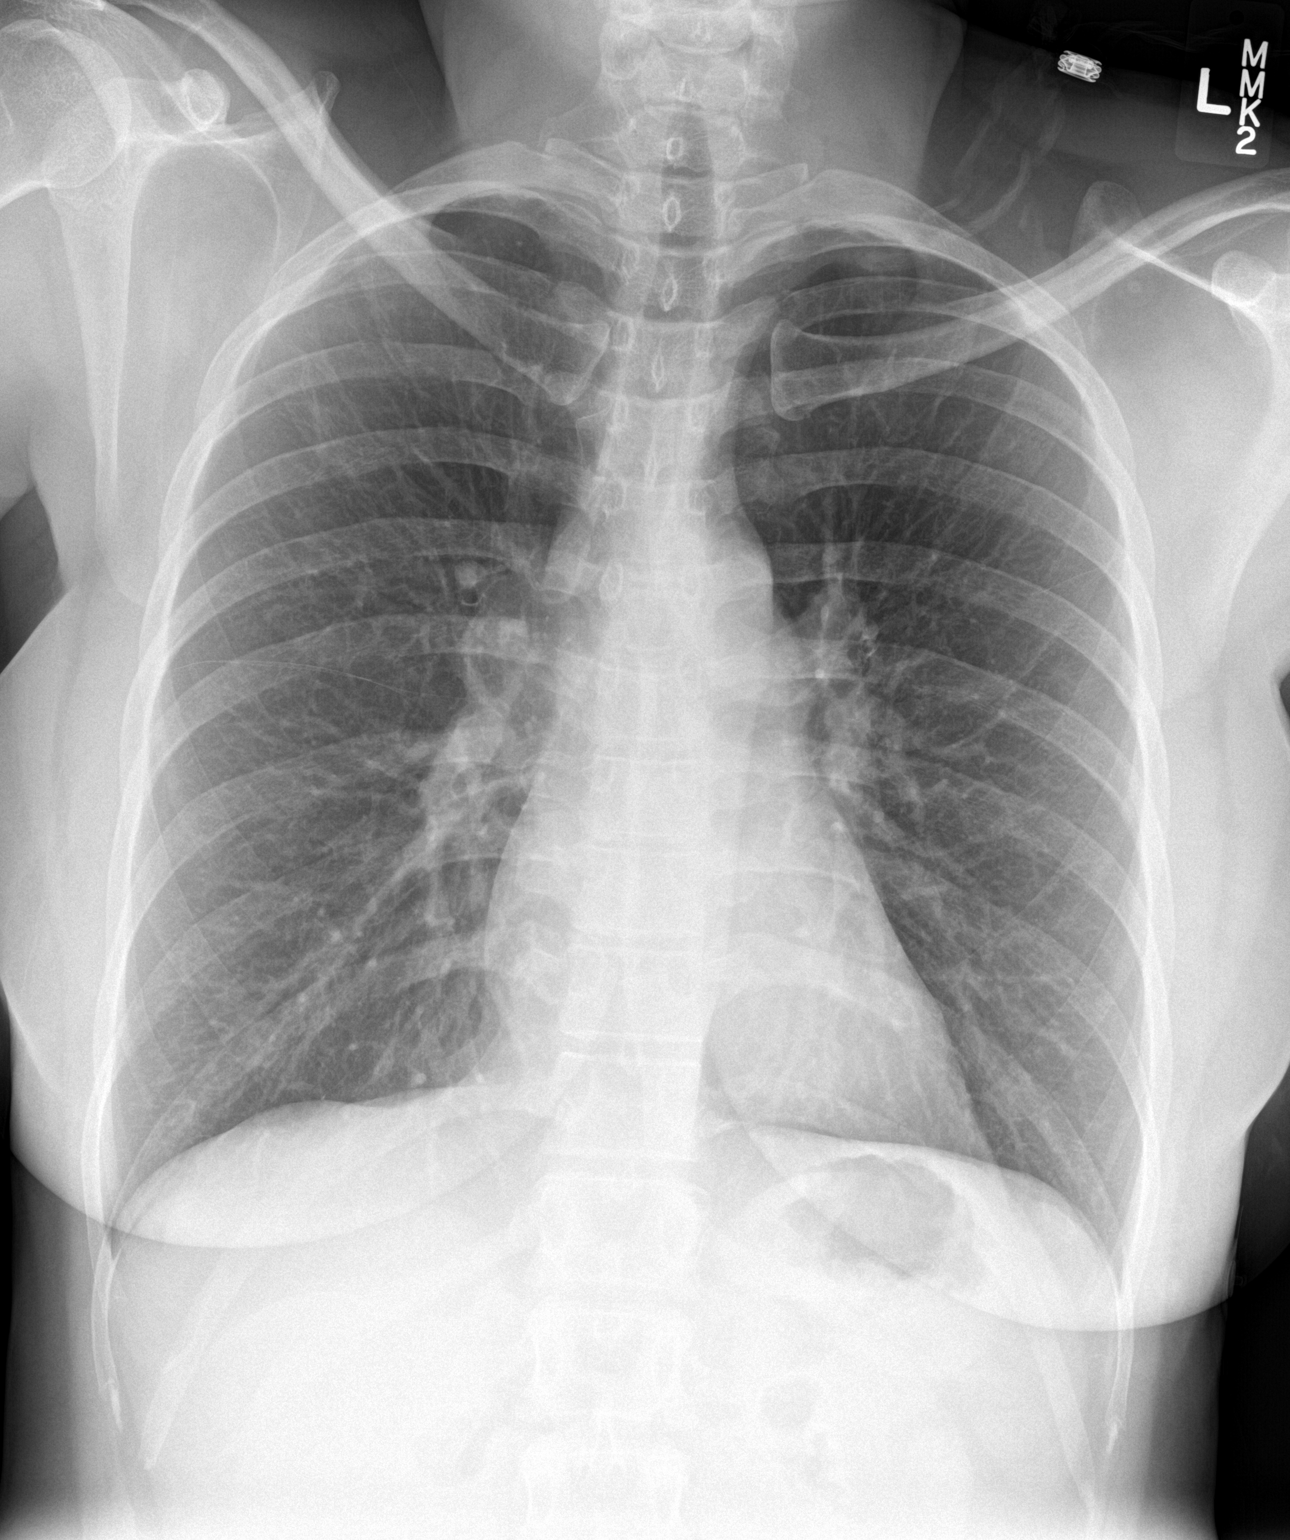

[chest lat]
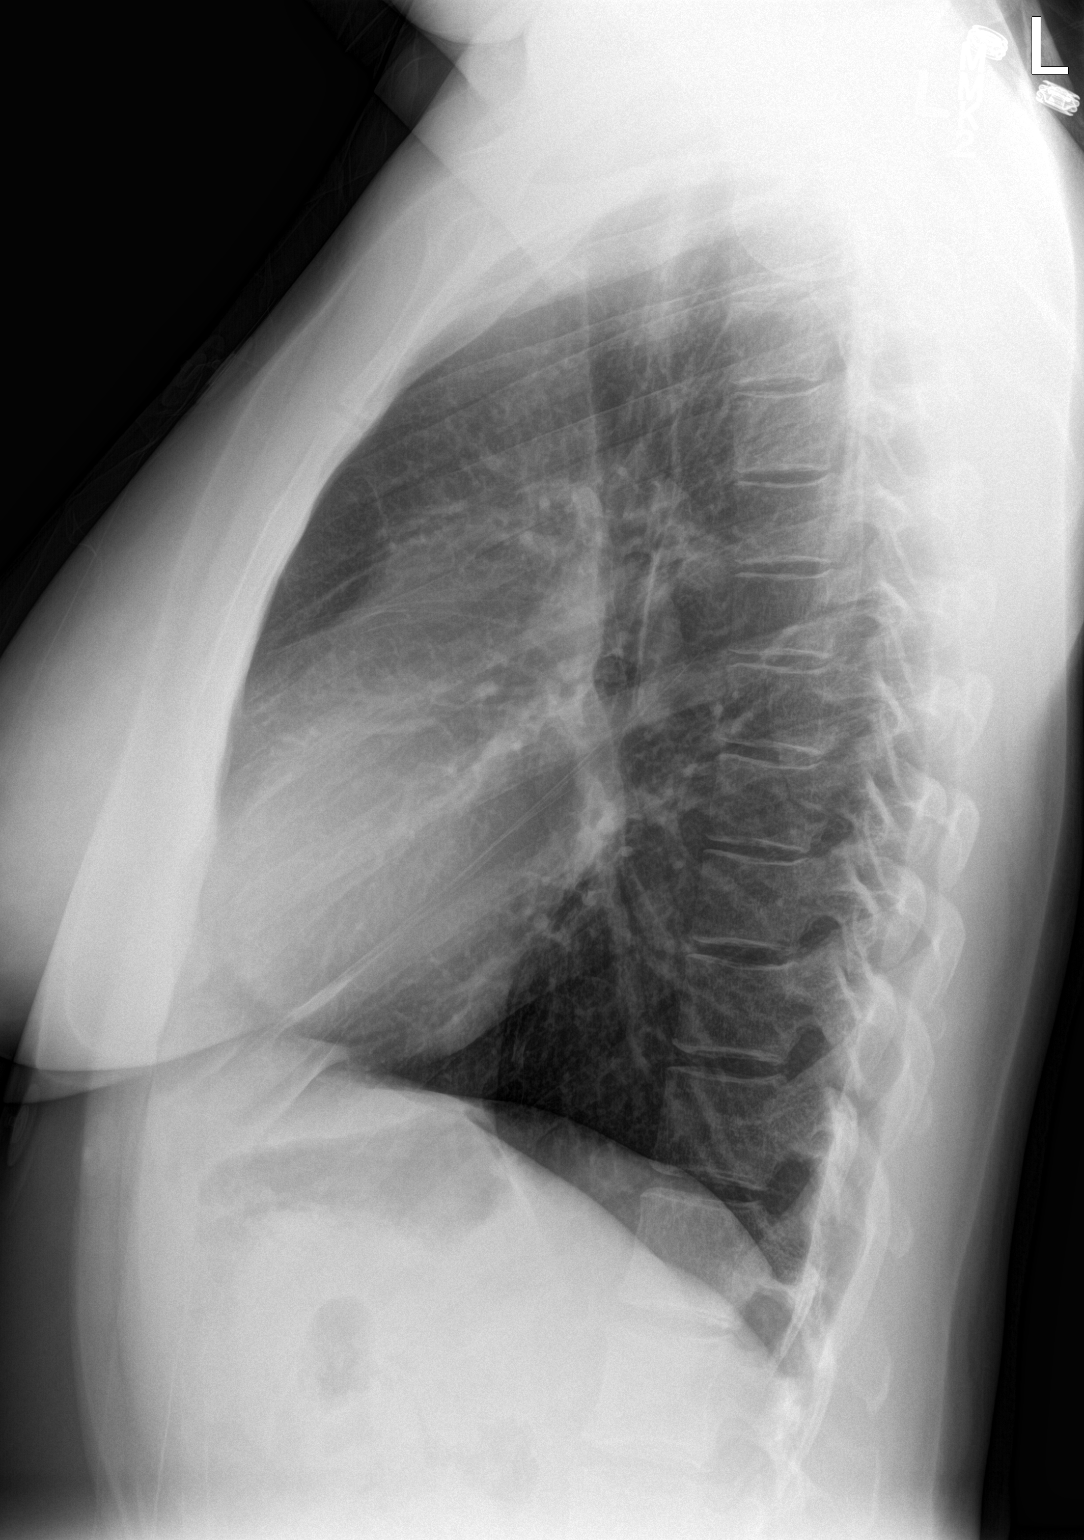

[2 of 2 positions shown; findings below may reference images not displayed]

FINDINGS: Cardiomediastinal silhouette is unremarkable. No acute infiltrate or
pleural effusion. No pulmonary edema. Bony thorax is unremarkable.
IMPRESSION: No active cardiopulmonary disease.

## 2023-08-10 DIAGNOSIS — Z793 Long term (current) use of hormonal contraceptives: Secondary | ICD-10-CM | POA: Diagnosis not present

## 2023-08-10 DIAGNOSIS — F17210 Nicotine dependence, cigarettes, uncomplicated: Secondary | ICD-10-CM | POA: Diagnosis not present

## 2023-08-10 DIAGNOSIS — Z791 Long term (current) use of non-steroidal anti-inflammatories (NSAID): Secondary | ICD-10-CM | POA: Diagnosis not present

## 2023-08-10 DIAGNOSIS — Z880 Allergy status to penicillin: Secondary | ICD-10-CM | POA: Diagnosis not present

## 2023-08-10 DIAGNOSIS — L0231 Cutaneous abscess of buttock: Secondary | ICD-10-CM | POA: Diagnosis not present

## 2023-08-12 DIAGNOSIS — F17210 Nicotine dependence, cigarettes, uncomplicated: Secondary | ICD-10-CM | POA: Diagnosis not present

## 2023-08-12 DIAGNOSIS — Z79899 Other long term (current) drug therapy: Secondary | ICD-10-CM | POA: Diagnosis not present

## 2023-08-12 DIAGNOSIS — L0231 Cutaneous abscess of buttock: Secondary | ICD-10-CM | POA: Diagnosis not present

## 2023-08-12 DIAGNOSIS — Z5189 Encounter for other specified aftercare: Secondary | ICD-10-CM | POA: Diagnosis not present

## 2023-08-12 DIAGNOSIS — Z4801 Encounter for change or removal of surgical wound dressing: Secondary | ICD-10-CM | POA: Diagnosis not present

## 2023-08-12 DIAGNOSIS — Z880 Allergy status to penicillin: Secondary | ICD-10-CM | POA: Diagnosis not present
